# Patient Record
Sex: Female | Born: 1975 | Race: White | Hispanic: No | Marital: Married | Smoking: Never smoker
Health system: Southern US, Community
[De-identification: ages and names within clinical notes are randomized; demographics above are authoritative.]

## PROBLEM LIST (undated history)

## (undated) DIAGNOSIS — Z8041 Family history of malignant neoplasm of ovary: Secondary | ICD-10-CM

## (undated) DIAGNOSIS — I10 Essential (primary) hypertension: Secondary | ICD-10-CM

## (undated) DIAGNOSIS — R87612 Low grade squamous intraepithelial lesion on cytologic smear of cervix (LGSIL): Secondary | ICD-10-CM

## (undated) DIAGNOSIS — J45909 Unspecified asthma, uncomplicated: Secondary | ICD-10-CM

## (undated) DIAGNOSIS — N83209 Unspecified ovarian cyst, unspecified side: Secondary | ICD-10-CM

## (undated) DIAGNOSIS — Z803 Family history of malignant neoplasm of breast: Secondary | ICD-10-CM

## (undated) DIAGNOSIS — I509 Heart failure, unspecified: Secondary | ICD-10-CM

## (undated) HISTORY — DX: Unspecified ovarian cyst, unspecified side: N83.209

## (undated) HISTORY — DX: Family history of malignant neoplasm of breast: Z80.3

## (undated) HISTORY — DX: Unspecified asthma, uncomplicated: J45.909

## (undated) HISTORY — DX: Family history of malignant neoplasm of ovary: Z80.41

## (undated) HISTORY — PX: OTHER SURGICAL HISTORY: SHX169

## (undated) HISTORY — DX: Heart failure, unspecified: I50.9

## (undated) HISTORY — PX: COLPOSCOPY: SHX161

## (undated) HISTORY — DX: Low grade squamous intraepithelial lesion on cytologic smear of cervix (LGSIL): R87.612

## (undated) HISTORY — DX: Essential (primary) hypertension: I10

---

## 2002-10-27 ENCOUNTER — Encounter: Payer: Self-pay | Admitting: Family Medicine

## 2002-10-27 ENCOUNTER — Ambulatory Visit: Admission: RE | Admit: 2002-10-27 | Discharge: 2002-10-27 | Payer: Self-pay | Admitting: Family Medicine

## 2006-01-14 ENCOUNTER — Ambulatory Visit: Payer: Self-pay | Admitting: Family Medicine

## 2006-01-20 ENCOUNTER — Ambulatory Visit: Payer: Self-pay | Admitting: Pulmonary Disease

## 2006-03-24 ENCOUNTER — Ambulatory Visit: Payer: Self-pay

## 2006-05-15 ENCOUNTER — Inpatient Hospital Stay: Payer: Self-pay | Admitting: Obstetrics and Gynecology

## 2008-03-04 ENCOUNTER — Observation Stay: Payer: Self-pay

## 2008-03-20 ENCOUNTER — Inpatient Hospital Stay: Payer: Self-pay | Admitting: Obstetrics & Gynecology

## 2008-03-23 ENCOUNTER — Observation Stay: Payer: Self-pay

## 2008-08-15 ENCOUNTER — Emergency Department: Payer: Self-pay | Admitting: Emergency Medicine

## 2008-10-02 ENCOUNTER — Encounter: Payer: Self-pay | Admitting: Gastroenterology

## 2008-10-02 ENCOUNTER — Ambulatory Visit: Payer: Self-pay | Admitting: Obstetrics & Gynecology

## 2008-10-25 ENCOUNTER — Ambulatory Visit: Payer: Self-pay | Admitting: Gastroenterology

## 2008-10-25 DIAGNOSIS — K5900 Constipation, unspecified: Secondary | ICD-10-CM | POA: Insufficient documentation

## 2008-10-25 DIAGNOSIS — R933 Abnormal findings on diagnostic imaging of other parts of digestive tract: Secondary | ICD-10-CM | POA: Insufficient documentation

## 2008-10-25 LAB — CONVERTED CEMR LAB
ALT: 16 units/L (ref 0–35)
AST: 20 units/L (ref 0–37)
Albumin: 3.4 g/dL — ABNORMAL LOW (ref 3.5–5.2)
Alkaline Phosphatase: 45 units/L (ref 39–117)
BUN: 9 mg/dL (ref 6–23)
Basophils Absolute: 0.1 10*3/uL (ref 0.0–0.1)
Basophils Relative: 0.9 % (ref 0.0–3.0)
Bilirubin, Direct: 0.2 mg/dL (ref 0.0–0.3)
CO2: 28 meq/L (ref 19–32)
Calcium: 9.3 mg/dL (ref 8.4–10.5)
Chloride: 108 meq/L (ref 96–112)
Creatinine, Ser: 0.7 mg/dL (ref 0.4–1.2)
Eosinophils Absolute: 0.8 10*3/uL — ABNORMAL HIGH (ref 0.0–0.7)
Eosinophils Relative: 10.9 % — ABNORMAL HIGH (ref 0.0–5.0)
GFR calc Af Amer: 124 mL/min
GFR calc non Af Amer: 102 mL/min
Glucose, Bld: 97 mg/dL (ref 70–99)
HCT: 37.8 % (ref 36.0–46.0)
Hemoglobin: 13.4 g/dL (ref 12.0–15.0)
Lymphocytes Relative: 26.9 % (ref 12.0–46.0)
MCHC: 35.4 g/dL (ref 30.0–36.0)
MCV: 90.1 fL (ref 78.0–100.0)
Monocytes Absolute: 0.5 10*3/uL (ref 0.1–1.0)
Monocytes Relative: 6 % (ref 3.0–12.0)
Neutro Abs: 4.2 10*3/uL (ref 1.4–7.7)
Neutrophils Relative %: 55.3 % (ref 43.0–77.0)
Platelets: 220 10*3/uL (ref 150–400)
Potassium: 4.3 meq/L (ref 3.5–5.1)
RBC: 4.2 M/uL (ref 3.87–5.11)
RDW: 11.9 % (ref 11.5–14.6)
Sodium: 142 meq/L (ref 135–145)
Total Bilirubin: 1.3 mg/dL — ABNORMAL HIGH (ref 0.3–1.2)
Total Protein: 7 g/dL (ref 6.0–8.3)
WBC: 7.6 10*3/uL (ref 4.5–10.5)

## 2008-10-28 ENCOUNTER — Ambulatory Visit (HOSPITAL_COMMUNITY): Admission: RE | Admit: 2008-10-28 | Discharge: 2008-10-28 | Payer: Self-pay | Admitting: Gastroenterology

## 2008-10-28 LAB — CONVERTED CEMR LAB: AFP-Tumor Marker: 2.1 ng/mL (ref 0.0–8.0)

## 2009-11-20 ENCOUNTER — Inpatient Hospital Stay: Payer: Self-pay

## 2010-09-06 ENCOUNTER — Encounter: Payer: Self-pay | Admitting: Gastroenterology

## 2011-06-02 ENCOUNTER — Ambulatory Visit: Payer: Self-pay

## 2012-09-22 ENCOUNTER — Ambulatory Visit (INDEPENDENT_AMBULATORY_CARE_PROVIDER_SITE_OTHER): Payer: Self-pay | Admitting: Cardiovascular Disease

## 2012-09-22 ENCOUNTER — Encounter: Payer: Self-pay | Admitting: Cardiovascular Disease

## 2012-09-22 VITALS — BP 118/80 | HR 68 | Ht 64.0 in | Wt 177.8 lb

## 2012-09-22 DIAGNOSIS — R079 Chest pain, unspecified: Secondary | ICD-10-CM | POA: Insufficient documentation

## 2012-09-22 NOTE — Patient Instructions (Addendum)
You are doing well. No medication changes were made.  Continue exercise Try red yeast rice as needed for cholesterol  Please call us if you have new issues that need to be addressed before your next appt.

## 2012-09-22 NOTE — Progress Notes (Signed)
   Patient ID: Tina Hill, female    DOB: 1975/09/06, 37 y.o.   MRN: [id number]  HPI Comments: Ms. Bertz is a very pleasant 37 year old woman with no known cardiac history, no diabetes, very remote smoking history for 7 months when she was growing up, mother of 4 children, who presents for evaluation of recent episodes of left-sided chest pain.  She reports having pain starting in October of 2013. She's had approximately 10 episodes. Some having sharp stabbing symptoms lasting for several seconds while at rest, rare episodes of longer lasting dullness in her left chest. 2 episodes while on the treadmill but mostly while at rest. She is busy most of the day, takes care of 4 children. She does have occasional lower extremity swelling. Most of her chest episodes have been in the daytime and while resting. No lightheadedness, dizziness, nausea, sweating. She does have occasional palpitations. She had weight gain after having her fourth child and has been recently dieting and exercising to lose weight.  Previous history of asthma, well controlled recently Father with no significant CAD, mother had aneurysm of her heart, also diabetes and hypertension  EKG shows normal sinus rhythm with rate 70 beats per minute with no significant ST or T wave changes   Medications Currently not on any medications  Review of Systems  Constitutional: Negative.   HENT: Negative.   Eyes: Negative.   Respiratory: Negative.   Cardiovascular: Positive for chest pain.  Gastrointestinal: Negative.   Musculoskeletal: Negative.   Skin: Negative.   Neurological: Negative.   Hematological: Negative.   Psychiatric/Behavioral: Negative.   All other systems reviewed and are negative.     BP 118/80  Pulse 68  Ht 5\' 4"  (1.626 m)  Wt 177 lb 12 oz (80.627 kg)  BMI 30.51 kg/m2  Physical Exam  Nursing note and vitals reviewed. Constitutional: She is oriented to person, place, and time. She appears well-developed  and well-nourished.  HENT:  Head: Normocephalic.  Nose: Nose normal.  Mouth/Throat: Oropharynx is clear and moist.  Eyes: Conjunctivae normal are normal. Pupils are equal, round, and reactive to light.  Neck: Normal range of motion. Neck supple. No JVD present.  Cardiovascular: Normal rate, regular rhythm, S1 normal, S2 normal, normal heart sounds and intact distal pulses.  Exam reveals no gallop and no friction rub.   No murmur heard. Pulmonary/Chest: Effort normal and breath sounds normal. No respiratory distress. She has no wheezes. She has no rales. She exhibits no tenderness.  Abdominal: Soft. Bowel sounds are normal. She exhibits no distension. There is no tenderness.  Musculoskeletal: Normal range of motion. She exhibits no edema and no tenderness.  Lymphadenopathy:    She has no cervical adenopathy.  Neurological: She is alert and oriented to person, place, and time. Coordination normal.  Skin: Skin is warm and dry. No rash noted. No erythema.  Psychiatric: She has a normal mood and affect. Her behavior is normal. Judgment and thought content normal.         Assessment and Plan

## 2012-09-22 NOTE — Assessment & Plan Note (Signed)
Atypical chest pain, typically at rest, not with exertion. She is working out on a regular basis now with minimal symptoms. We did discuss treadmill testing. She does not have any risk factors. She will continue to work out and call our office for further symptoms.   We have discussed weight loss, continued exercise, possibly starting red yeast rice if cholesterol continues to be elevated.

## 2017-10-24 ENCOUNTER — Ambulatory Visit: Payer: Self-pay | Admitting: Obstetrics and Gynecology

## 2017-11-15 ENCOUNTER — Ambulatory Visit: Payer: Self-pay | Admitting: Obstetrics and Gynecology

## 2017-11-15 ENCOUNTER — Encounter: Payer: Self-pay | Admitting: Obstetrics and Gynecology

## 2019-04-05 ENCOUNTER — Ambulatory Visit (INDEPENDENT_AMBULATORY_CARE_PROVIDER_SITE_OTHER): Payer: Self-pay | Admitting: Obstetrics and Gynecology

## 2019-04-05 ENCOUNTER — Other Ambulatory Visit: Payer: Self-pay

## 2019-04-05 ENCOUNTER — Encounter: Payer: Self-pay | Admitting: Obstetrics and Gynecology

## 2019-04-05 VITALS — BP 110/80 | Ht 64.0 in | Wt 181.4 lb

## 2019-04-05 DIAGNOSIS — M545 Low back pain, unspecified: Secondary | ICD-10-CM

## 2019-04-05 DIAGNOSIS — R102 Pelvic and perineal pain unspecified side: Secondary | ICD-10-CM

## 2019-04-05 DIAGNOSIS — R350 Frequency of micturition: Secondary | ICD-10-CM

## 2019-04-05 DIAGNOSIS — Z124 Encounter for screening for malignant neoplasm of cervix: Secondary | ICD-10-CM

## 2019-04-05 LAB — POCT URINALYSIS DIPSTICK
Bilirubin, UA: NEGATIVE
Blood, UA: NEGATIVE
Glucose, UA: NEGATIVE
Ketones, UA: NEGATIVE
Nitrite, UA: NEGATIVE
Protein, UA: NEGATIVE
Spec Grav, UA: 1.01 (ref 1.010–1.025)
pH, UA: 6 (ref 5.0–8.0)

## 2019-04-05 MED ORDER — CIPROFLOXACIN HCL 500 MG PO TABS: 500.0000 mg | ORAL_TABLET | Freq: Two times a day (BID) | ORAL | 0 refills | Status: AC

## 2019-04-05 NOTE — Progress Notes (Signed)
Tower, Wynelle Fanny, MD   Chief Complaint  Patient presents with  . Pelvic Pain    started off on back left side moved to pelvic area on off for the last week    HPI:      Ms. Tina Hill is a 43 y.o. [id number] who LMP was Patient's last menstrual period was 03/26/2019 (exact date)., presents today for NP> 64yrs eval of back and pelvic pain. Pain started as sharp, stabbing LT flank/mid-back pain about a wk ago, lasting 15 min. Pt couldn't find comfortable position and had mild nausea. Sx decreased and since then have moved to a dull, achy LLQ, constant pain and LT low back pain. Sx improve with NSAIDs. Pt with urinary frequency/urgency/chills, without dysuria/hematuria. No vag or GI sx, no hx of kidney stones. Hx of ovar cyst in past. Pt with monthly menses. Pt is sex active with husband, using vasectomy, no dyspareunia.   Last pap 05/26/15 no abnormalities/neg HPV DNA. 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 FH ovar cancer. Due for annual and mammo but is self-pay.   Past Medical History:  Diagnosis Date  . Asthma   . Family history of breast cancer   . Family history of ovarian cancer   . Ovarian cyst   . Pap smear abnormality of cervix with LGSIL     Past Surgical History:  Procedure Laterality Date  . COLPOSCOPY    . none      Family History  Problem Relation Age of Onset  . Breast cancer Mother 66  . Hypertension Mother   . Hypothyroidism Mother   . Ovarian cancer Mother 31  . Hypothyroidism Sister     Social History   Socioeconomic History  . Marital status: Married    Spouse name: Not on file  . Number of children: Not on file  . Years of education: Not on file  . Highest education level: Not on file  Occupational History  . Not on file  Social Needs  . Financial resource strain: Not on file  . Food insecurity    Worry: Not on file    Inability: Not on file  . Transportation needs    Medical: Not on file    Non-medical: Not on file  Tobacco Use  . Smoking status:  Never Smoker  . Smokeless tobacco: Never Used  Substance and Sexual Activity  . Alcohol use: Yes  . Drug use: Never  . Sexual activity: Yes    Birth control/protection: None, Surgical    Comment: Vasectomy  Lifestyle  . Physical activity    Days per week: Not on file    Minutes per session: Not on file  . Stress: Not on file  Relationships  . Social Herbalist on phone: Not on file    Gets together: Not on file    Attends religious service: Not on file    Active member of club or organization: Not on file    Attends meetings of clubs or organizations: Not on file    Relationship status: Not on file  . Intimate partner violence    Fear of current or ex partner: Not on file    Emotionally abused: Not on file    Physically abused: Not on file    Forced sexual activity: Not on file  Other Topics Concern  . Not on file  Social History Narrative  . Not on file    No outpatient medications prior to visit.  No facility-administered medications prior to visit.       ROS:  Review of Systems  Constitutional: Positive for chills. Negative for fatigue, fever and unexpected weight change.  Respiratory: Negative for cough, shortness of breath and wheezing.   Cardiovascular: Negative for chest pain, palpitations and leg swelling.  Gastrointestinal: Negative for blood in stool, constipation, diarrhea, nausea and vomiting.  Endocrine: Negative for cold intolerance, heat intolerance and polyuria.  Genitourinary: Positive for frequency and pelvic pain. Negative for dyspareunia, dysuria, flank pain, genital sores, hematuria, menstrual problem, urgency, vaginal bleeding, vaginal discharge and vaginal pain.  Musculoskeletal: Positive for back pain. Negative for joint swelling and myalgias.  Skin: Negative for rash.  Neurological: Negative for dizziness, syncope, light-headedness, numbness and headaches.  Hematological: Negative for adenopathy.  Psychiatric/Behavioral: Negative  for agitation, confusion, sleep disturbance and suicidal ideas. The patient is not nervous/anxious.     OBJECTIVE:   Vitals:  BP 110/80   Ht 5\' 4"  (1.626 m)   Wt 181 lb 6.4 oz (82.3 kg)   LMP 03/26/2019 (Exact Date)   BMI 31.14 kg/m   Physical Exam Vitals signs reviewed.  Constitutional:      Appearance: She is well-developed. She is not ill-appearing or toxic-appearing.  Neck:     Musculoskeletal: Normal range of motion.  Pulmonary:     Effort: Pulmonary effort is normal.  Abdominal:     Tenderness: There is abdominal tenderness in the right lower quadrant, suprapubic area and left lower quadrant. There is left CVA tenderness. There is no right CVA tenderness, guarding or rebound.  Genitourinary:    General: Normal vulva.     Pubic Area: No rash.      Labia:        Right: No rash, tenderness or lesion.        Left: No rash, tenderness or lesion.      Vagina: Normal. No vaginal discharge, erythema or tenderness.     Cervix: Normal.     Uterus: Normal. Tender. Not enlarged.      Adnexa:        Right: Tenderness present. No mass.         Left: Tenderness present. No mass.    Musculoskeletal: Normal range of motion.  Skin:    General: Skin is warm and dry.  Neurological:     General: No focal deficit present.     Mental Status: She is alert and oriented to person, place, and time.     Cranial Nerves: No cranial nerve deficit.  Psychiatric:        Mood and Affect: Mood normal.        Behavior: Behavior normal.        Thought Content: Thought content normal.        Judgment: Judgment normal.     Results: Results for orders placed or performed in visit on 04/05/19 (from the past 24 hour(s))  POCT Urinalysis Dipstick     Status: Abnormal   Collection Time: 04/05/19 10:17 AM  Result Value Ref Range   Color, UA yellow    Clarity, UA clear    Glucose, UA Negative Negative   Bilirubin, UA neg    Ketones, UA neg    Spec Grav, UA 1.010 1.010 - 1.025   Blood, UA neg     pH, UA 6.0 5.0 - 8.0   Protein, UA Negative Negative   Urobilinogen, UA     Nitrite, UA neg    Leukocytes, UA Trace (A) Negative  Appearance     Odor      Assessment/Plan: Urinary frequency - Questionable UA, Check C&S. Will call with results. Given sx, treat with cipro for now.  - Plan: POCT Urinalysis Dipstick, Urine Culture, ciprofloxacin (CIPRO) 500 MG tablet  Acute left-sided low back pain without sciatica - Question kidney stones given sx hx. Treat for UTI in meantime to see what sx do. Pt stable currently. If sx persist after abx, will send for kidney stone eval - Plan: ciprofloxacin (CIPRO) 500 MG tablet  Pelvic pain - Question UTI vs referred kidney pain vs other. Will f/u with C&S results. May need u/s.   Cervical cancer screening - Refer to [facility] for annual/mammo - Plan: Ambulatory Referral to [facility]  PT IS SELF PAY  Meds ordered this encounter  Medications  . ciprofloxacin (CIPRO) 500 MG tablet    Sig: Take 1 tablet (500 mg total) by mouth 2 (two) times daily for 5 days.    Dispense:  10 tablet    Refill:  0    Order Specific Question:   Supervising Provider    Answer:   Nadara MustardHARRIS, ROBERT P [[id number]]      Return if symptoms worsen or fail to improve.  Alicia B. Copland, PA-C 04/05/2019 10:21 AM

## 2019-04-05 NOTE — Patient Instructions (Signed)
I value your feedback and entrusting us with your care. If you get a [facility] patient survey, I would appreciate you taking the time to let us know about your experience today. Thank you! 

## 2019-04-07 LAB — URINE CULTURE

## 2019-05-04 ENCOUNTER — Telehealth: Payer: Self-pay | Admitting: Obstetrics and Gynecology

## 2019-05-04 NOTE — Telephone Encounter (Signed)
Called and spoke with patient to call and contact Alyse Low or Vita Barley at [facility] for Assistance with Cervical Cancer Screening. [phone]

## 2019-05-16 ENCOUNTER — Ambulatory Visit: Payer: Self-pay

## 2019-05-23 ENCOUNTER — Ambulatory Visit: Payer: Self-pay

## 2019-06-05 ENCOUNTER — Other Ambulatory Visit: Payer: Self-pay

## 2019-06-06 ENCOUNTER — Ambulatory Visit
Admission: RE | Admit: 2019-06-06 | Discharge: 2019-06-06 | Disposition: A | Discharge status: Home / Self Care | Payer: Self-pay | Source: Ambulatory Visit | Attending: Oncology | Admitting: Oncology

## 2019-06-06 ENCOUNTER — Encounter: Payer: Self-pay | Admitting: *Deleted

## 2019-06-06 ENCOUNTER — Other Ambulatory Visit: Payer: Self-pay

## 2019-06-06 ENCOUNTER — Ambulatory Visit: Payer: Self-pay | Attending: Oncology | Admitting: *Deleted

## 2019-06-06 ENCOUNTER — Encounter: Payer: Self-pay | Admitting: Obstetrics and Gynecology

## 2019-06-06 VITALS — BP 132/77 | HR 62 | Temp 98.7°F | Ht 64.0 in | Wt 185.0 lb

## 2019-06-06 DIAGNOSIS — Z0000 Encounter for general adult medical examination without abnormal findings: Secondary | ICD-10-CM | POA: Insufficient documentation

## 2019-06-06 NOTE — Progress Notes (Signed)
  Subjective:     Patient ID: Tina Hill, female   DOB: 04-21-1976, 43 y.o.   MRN: [id number]  HPI   Review of Systems     Objective:   Physical Exam Chest:     Breasts:        Right: No swelling, bleeding, inverted nipple, mass, nipple discharge, skin change or tenderness.        Left: No swelling, bleeding, inverted nipple, mass, nipple discharge, skin change or tenderness.    Lymphadenopathy:     Upper Body:     Right upper body: No supraclavicular or axillary adenopathy.     Left upper body: No supraclavicular or axillary adenopathy.        Assessment:     43 year old White female presented to [facility] for clinical breast exam and pap smear.  Clinical breast exam without dominant mass, skin changes, nipple discharge or lymphadenopathy.  Taught self breast awareness.  Collected for pap smear without difficulty.  Tyrer-Cuzick breast cancer risk assessment with a lifetime risk of 24%.  Per NCCN guidelines patient meets guidelines for modified imagaing and genetic testing.  Discussed high risk for breast cancer and need to be referred to our high risk breast clinic for possible genetic testing.  Patient with family history of her mom with breast cancer times 3 and ovarian cancer.  On clinical exam, the patient's thyroid felt enlarged.  She does complain of increased fatigue and intolerance to heat and cold.  She does not have a primary care provider at this time.  Patient has been screened for eligibility.  She does not have any insurance, Medicare or Medicaid.  She also meets financial eligibility.  Hand-out given on the Affordable Care Act.    Plan:     Screening mammogram ordered.  Specimen for pap sent to the lab.  Referral to high risk clinic placed.  Jeanella Anton to assist with scheduling patient to see a primary care provider for her enlarged thyroid.  Will follow up per [facility] protocol.

## 2019-06-12 ENCOUNTER — Encounter: Payer: Self-pay | Admitting: *Deleted

## 2019-06-13 ENCOUNTER — Other Ambulatory Visit: Payer: Self-pay

## 2019-06-14 ENCOUNTER — Inpatient Hospital Stay: Payer: Self-pay | Attending: Oncology | Admitting: Oncology

## 2019-06-14 ENCOUNTER — Encounter: Payer: Self-pay | Admitting: Oncology

## 2019-06-14 ENCOUNTER — Other Ambulatory Visit: Payer: Self-pay

## 2019-06-14 VITALS — BP 148/74 | HR 87 | Temp 98.5°F | Resp 16 | Wt 188.9 lb

## 2019-06-14 DIAGNOSIS — Z8041 Family history of malignant neoplasm of ovary: Secondary | ICD-10-CM | POA: Insufficient documentation

## 2019-06-14 DIAGNOSIS — Z0000 Encounter for general adult medical examination without abnormal findings: Secondary | ICD-10-CM

## 2019-06-14 DIAGNOSIS — R5383 Other fatigue: Secondary | ICD-10-CM | POA: Insufficient documentation

## 2019-06-14 DIAGNOSIS — Z803 Family history of malignant neoplasm of breast: Secondary | ICD-10-CM | POA: Insufficient documentation

## 2019-06-14 NOTE — Progress Notes (Signed)
Here for evaluation of family history of breast cancer.

## 2019-06-15 LAB — PAP LB AND HPV HIGH-RISK: HPV, high-risk: NEGATIVE

## 2019-06-18 ENCOUNTER — Encounter: Payer: Self-pay | Admitting: *Deleted

## 2019-06-18 ENCOUNTER — Other Ambulatory Visit: Payer: Self-pay | Admitting: *Deleted

## 2019-06-18 DIAGNOSIS — Z1231 Encounter for screening mammogram for malignant neoplasm of breast: Secondary | ICD-10-CM

## 2019-06-18 NOTE — Progress Notes (Signed)
Called and left patient a message to return my call.  I would like to refer her to a gynecologist for evaluation of endometrial cells on her pap smear.

## 2019-06-18 NOTE — Progress Notes (Signed)
Hematology/Oncology Consult note [facility] Telephone:([phone] [phone]   Patient Care Team: Copland, [name] as PCP - General (Obstetrics and Gynecology) Rico Junker, RN as Registered Nurse Theodore Demark, RN as Registered Nurse  REFERRING PROVIDER: Lloyd Huger, MD  CHIEF COMPLAINTS/REASON FOR VISIT:  Evaluation of high risk breast cancer  HISTORY OF PRESENTING ILLNESS:   Tina Hill is a  43 y.o.  female with PMH listed below was seen in consultation at the request of  Grayland Ormond, Kathlene November, MD  for evaluation of high risk breast cancer. Patient reports family history of ovarian cancer and breast cancer.  Mother was diagnosed of ovarian cancer at age of 41.  Mom was diagnosed with breast cancer for 3 times per patient. She denies any breast mass, skin changes, nipple discharges. She reports increased fatigue and intolerance of heat and cold.  Is being set up to see primary care provider for evaluation of thyroid function. Menarche [age], first live birth 5  Review of Systems  Constitutional: Positive for fatigue. Negative for appetite change, chills and fever.  HENT:   Negative for hearing loss and voice change.   Eyes: Negative for eye problems.  Respiratory: Negative for chest tightness and cough.   Cardiovascular: Negative for chest pain.  Gastrointestinal: Negative for abdominal distention, abdominal pain and blood in stool.  Endocrine: Negative for hot flashes.  Genitourinary: Negative for difficulty urinating and frequency.   Musculoskeletal: Negative for arthralgias.  Skin: Negative for itching and rash.  Neurological: Negative for extremity weakness.  Hematological: Negative for adenopathy.  Psychiatric/Behavioral: Negative for confusion.    MEDICAL HISTORY:  Past Medical History:  Diagnosis Date  . Asthma   . Family history of breast cancer   . Family history of ovarian cancer   . Ovarian cyst    . Pap smear abnormality of cervix with LGSIL     SURGICAL HISTORY: Past Surgical History:  Procedure Laterality Date  . COLPOSCOPY    . none      SOCIAL HISTORY: Social History   Socioeconomic History  . Marital status: Married    Spouse name: Not on file  . Number of children: Not on file  . Years of education: Not on file  . Highest education level: Not on file  Occupational History  . Not on file  Social Needs  . Financial resource strain: Not on file  . Food insecurity    Worry: Not on file    Inability: Not on file  . Transportation needs    Medical: Not on file    Non-medical: Not on file  Tobacco Use  . Smoking status: Never Smoker  . Smokeless tobacco: Never Used  Substance and Sexual Activity  . Alcohol use: Yes  . Drug use: Never  . Sexual activity: Yes    Birth control/protection: None, Surgical    Comment: Vasectomy  Lifestyle  . Physical activity    Days per week: Not on file    Minutes per session: Not on file  . Stress: Not on file  Relationships  . Social Herbalist on phone: Not on file    Gets together: Not on file    Attends religious service: Not on file    Active member of club or organization: Not on file    Attends meetings of clubs or organizations: Not on file    Relationship status: Not on file  . Intimate partner violence  Fear of current or ex partner: Not on file    Emotionally abused: Not on file    Physically abused: Not on file    Forced sexual activity: Not on file  Other Topics Concern  . Not on file  Social History Narrative  . Not on file    FAMILY HISTORY: Family History  Problem Relation Age of Onset  . Breast cancer Mother 25  . Hypertension Mother   . Hypothyroidism Mother   . Ovarian cancer Mother 42  . 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  MEDICATIONS:  No current outpatient medications on file.   No current facility-administered medications for this  visit.      PHYSICAL EXAMINATION: ECOG PERFORMANCE STATUS: 1 - Symptomatic but completely ambulatory Vitals:   06/14/19 0930  BP: (!) 148/74  Pulse: 87  Resp: 16  Temp: 98.5 F (36.9 C)  SpO2: 100%   Filed Weights   06/14/19 0930  Weight: 188 lb 14.4 oz (85.7 kg)    Physical Exam Constitutional:      General: She is not in acute distress. HENT:     Head: Normocephalic and atraumatic.  Eyes:     General: No scleral icterus.    Pupils: Pupils are equal, round, and reactive to light.  Neck:     Musculoskeletal: Normal range of motion and neck supple.     Comments: Enlarged thyroid Cardiovascular:     Rate and Rhythm: Normal rate and regular rhythm.     Heart sounds: Normal heart sounds.  Pulmonary:     Effort: Pulmonary effort is normal. No respiratory distress.     Breath sounds: No wheezing.  Abdominal:     General: Bowel sounds are normal. There is no distension.     Palpations: Abdomen is soft. There is no mass.     Tenderness: There is no abdominal tenderness.  Musculoskeletal: Normal range of motion.        General: No deformity.  Skin:    General: Skin is warm and dry.     Findings: No erythema or rash.  Neurological:     Mental Status: She is alert and oriented to person, place, and time.     Cranial Nerves: No cranial nerve deficit.     Coordination: Coordination normal.  Psychiatric:        Behavior: Behavior normal.        Thought Content: Thought content normal.   Patient declines breast exam  LABORATORY DATA:  I have reviewed the data as listed Lab Results  Component Value Date   WBC 7.6 10/25/2008   HGB 13.4 10/25/2008   HCT 37.8 10/25/2008   MCV 90.1 10/25/2008   PLT 220 10/25/2008   No results for input(s): NA, K, CL, CO2, GLUCOSE, BUN, CREATININE, CALCIUM, GFRNONAA, GFRAA, PROT, ALBUMIN, AST, ALT, ALKPHOS, BILITOT, BILIDIR, IBILI in the last 8760 hours. Iron/TIBC/Ferritin/ %Sat No results found for: IRON, TIBC, FERRITIN, IRONPCTSAT     RADIOGRAPHIC STUDIES: I have personally reviewed the radiological images as listed and agreed with the findings in the report.  Ms Digital Screening Tomo Bilateral  Result Date: 06/06/2019 CLINICAL DATA:  Screening. EXAM: DIGITAL SCREENING BILATERAL MAMMOGRAM WITH TOMO AND CAD COMPARISON:  Previous exam(s). ACR Breast Density Category c: The breast tissue is heterogeneously dense, which may obscure small masses. FINDINGS: There are no findings suspicious for malignancy. Images were processed with CAD. IMPRESSION: No mammographic evidence of malignancy. A result letter of  this screening mammogram will be mailed directly to the patient. RECOMMENDATION: 1.  Screening mammogram in one year. (Code:SM-B-01Y) 2. Per American Cancer Society guidelines, if the patient has a calculated lifetime risk of developing breast cancer of greater than 20%, annual screening MRI of the breasts would be recommended at the time of screening mammography. BI-RADS CATEGORY  1: Negative. Electronically Signed   By: Ammie Ferrier M.D.   On: 06/06/2019 14:10      ASSESSMENT & PLAN:  1. Family history of breast cancer   2. Preventative health care   3. Family history of ovarian cancer    Mammogram was independent reviewed by me discussed with patient.  No evidence of malignancy. Baker Janus model 1.4% in 5 years, 18.3 % lifetime developing breast cancer.  Discussed with patient that for patient who has gail model breast cancer risk less than 1.7%, chemoprevention is not offered. Tyrer-Cuzick breast cancer risk 24%, discussed with patient this is used to calculate the patient's likelihood of carry BRCA mutation.  Given her family history of ovarian cancer and breast cancer, I recommend patient to discuss with genetic counselor and proceed with genetic testing.  She voices understanding and agrees with the treatment.  Orders Placed This Encounter  Procedures  . Ambulatory referral to Genetics    Referral Priority:   Routine     Referral Type:   Consultation    Referral Reason:   Specialty Services Required    Referred to Provider:   Faith Rogue T    Number of Visits Requested:   1    All questions were answered. The patient knows to call the clinic with any problems questions or concerns.   Lloyd Huger, MD    Return of visit: Follow-up to be determined Thank you for this kind referral and the opportunity to participate in the care of this patient. A copy of today's note is routed to referring provider  Total face to face encounter time for this patient visit was 45 min. >50% of the time was  spent in counseling and coordination of care.    Earlie Server, MD, PhD Hematology Oncology [facility] Pager- [phone] 06/18/2019

## 2019-06-18 NOTE — Progress Notes (Signed)
Called patient and discussed her normal HPV negative pap, but that there are endometrial cells present on the cytology.  Patient states she is still having regular month cycles, but sometimes her period may be a day longer than usual.  I think with a BMI of over 31 she is considered overweight, and that is a risk factor for endometrial cancer.  I think it would be prudent to refer her to GYN for further evaluation.  She has been scheduled to see Dr. Kenton Kingfisher on 06/28/19 @ 11:20.  I also discussed need for 6 month follow up breast MRI since she is considered high risk for breast cancer.  I will schedule that appointment and mail it to her.  She has an appointment with a genetic counselor in January.  Will follow up per [facility] protocol.

## 2019-06-27 ENCOUNTER — Other Ambulatory Visit: Payer: Self-pay

## 2019-06-27 ENCOUNTER — Other Ambulatory Visit (HOSPITAL_COMMUNITY)
Admission: RE | Admit: 2019-06-27 | Discharge: 2019-06-27 | Disposition: A | Discharge status: Home / Self Care | Payer: Self-pay | Source: Ambulatory Visit | Attending: Obstetrics & Gynecology | Admitting: Obstetrics & Gynecology

## 2019-06-27 ENCOUNTER — Encounter: Payer: Self-pay | Admitting: Obstetrics & Gynecology

## 2019-06-27 ENCOUNTER — Ambulatory Visit (INDEPENDENT_AMBULATORY_CARE_PROVIDER_SITE_OTHER): Payer: Self-pay | Admitting: Obstetrics & Gynecology

## 2019-06-27 VITALS — BP 120/80 | Ht 64.0 in | Wt 189.0 lb

## 2019-06-27 DIAGNOSIS — R87618 Other abnormal cytological findings on specimens from cervix uteri: Secondary | ICD-10-CM

## 2019-06-27 NOTE — Progress Notes (Signed)
HPI:  Patient is a 43 y.o. [id number] presenting for concerns over recent PAP results from [facility].  SHe had normal PAP and HPV yet Endometrial Cells on PAP that are unexplained.  Not on period at that time.  Has reg cycles.   Prior abn PAP 2012, normal colpo then.  BC- vasec.  PMHx: She  has a past medical history of Asthma, Family history of breast cancer, Family history of ovarian cancer, Ovarian cyst, and Pap smear abnormality of cervix with LGSIL. Also,  has a past surgical history that includes none and Colposcopy., family history includes Breast cancer (age of onset: 64) in her mother; Hypertension in her mother; Hypothyroidism in her mother and sister; Ovarian cancer (age of onset: 65) in her mother.,  reports that she has never smoked. She has never used smokeless tobacco. She reports current alcohol use. She reports that she does not use drugs.  She currently has no medications in their medication list. Also, is allergic to macadamia nut oil.  Review of Systems  Constitutional: Negative for chills, fever and malaise/fatigue.  HENT: Negative for congestion, sinus pain and sore throat.   Eyes: Negative for blurred vision and pain.  Respiratory: Negative for cough and wheezing.   Cardiovascular: Negative for chest pain and leg swelling.  Gastrointestinal: Negative for abdominal pain, constipation, diarrhea, heartburn, nausea and vomiting.  Genitourinary: Negative for dysuria, frequency, hematuria and urgency.  Musculoskeletal: Negative for back pain, joint pain, myalgias and neck pain.  Skin: Negative for itching and rash.  Neurological: Negative for dizziness, tremors and weakness.  Endo/Heme/Allergies: Does not bruise/bleed easily.  Psychiatric/Behavioral: Negative for depression. The patient is not nervous/anxious and does not have insomnia.     Objective: BP 120/80   Ht 5\' 4"  (1.626 m)   Wt 189 lb (85.7 kg)   LMP 06/26/2019   BMI 32.44 kg/m  Filed Weights   06/27/19 1120   Weight: 189 lb (85.7 kg)   Body mass index is 32.44 kg/m.  Physical examination Physical Exam Constitutional:      General: She is not in acute distress.    Appearance: She is well-developed.  Genitourinary:     Pelvic exam was performed with patient supine.     Vagina and uterus normal.     No vaginal erythema or bleeding.     No cervical motion tenderness, discharge, polyp or nabothian cyst.     Uterus is mobile.     Uterus is not enlarged.     No uterine mass detected.    Uterus is midaxial.     No right or left adnexal mass present.     Right adnexa not tender.     Left adnexa not tender.  HENT:     Head: Normocephalic and atraumatic.     Nose: Nose normal.  Abdominal:     General: There is no distension.     Palpations: Abdomen is soft.     Tenderness: There is no abdominal tenderness.  Musculoskeletal: Normal range of motion.  Neurological:     Mental Status: She is alert and oriented to person, place, and time.     Cranial Nerves: No cranial nerve deficit.  Skin:    General: Skin is warm and dry.  Psychiatric:        Attention and Perception: Attention normal.        Mood and Affect: Mood and affect normal.        Speech: Speech normal.  Behavior: Behavior normal.        Thought Content: Thought content normal.        Judgment: Judgment normal.     ASSESSMENT:     ICD-10-CM   1. Unexplained endometrial cells on cervical cytology  R87.618 Surgical pathology    Endometrial Biopsy After discussion with the patient regarding her abnormal uterine bleeding I recommended that she proceed with an endometrial biopsy for further diagnosis. The risks, benefits, alternatives, and indications for an endometrial biopsy were discussed with the patient in detail. She understood the risks including infection, bleeding, cervical laceration and uterine perforation.  Verbal consent was obtained.   PROCEDURE NOTE:  Pipelle endometrial biopsy was performed using aseptic  technique with iodine preparation.  The uterus was sounded to a length of 7 cm.  Adequate sampling was obtained with minimal blood loss.  The patient tolerated the procedure well.  Disposition will be pending pathology.  Barnett Applebaum, MD, [facility], [facility] 06/27/2019  11:40 AM

## 2019-06-27 NOTE — Patient Instructions (Signed)

## 2019-06-28 ENCOUNTER — Ambulatory Visit: Payer: Self-pay

## 2019-06-29 LAB — SURGICAL PATHOLOGY

## 2019-09-13 ENCOUNTER — Inpatient Hospital Stay: Payer: Self-pay | Attending: Oncology | Admitting: Licensed Clinical Social Worker

## 2019-09-13 ENCOUNTER — Inpatient Hospital Stay: Payer: Self-pay

## 2019-12-06 ENCOUNTER — Ambulatory Visit: Admission: RE | Admit: 2019-12-06 | Payer: Self-pay | Source: Ambulatory Visit

## 2020-03-21 IMAGING — MG DIGITAL SCREENING BILAT W/ TOMO W/ CAD
6 of 12 series · 6 of 36 positions shown · non-contrast
Comparison: Previous exam(s).

CLINICAL DATA: Screening.

EXAM:
DIGITAL SCREENING BILATERAL MAMMOGRAM WITH TOMO AND CAD

[R CC synth-2D (1 of 2)]
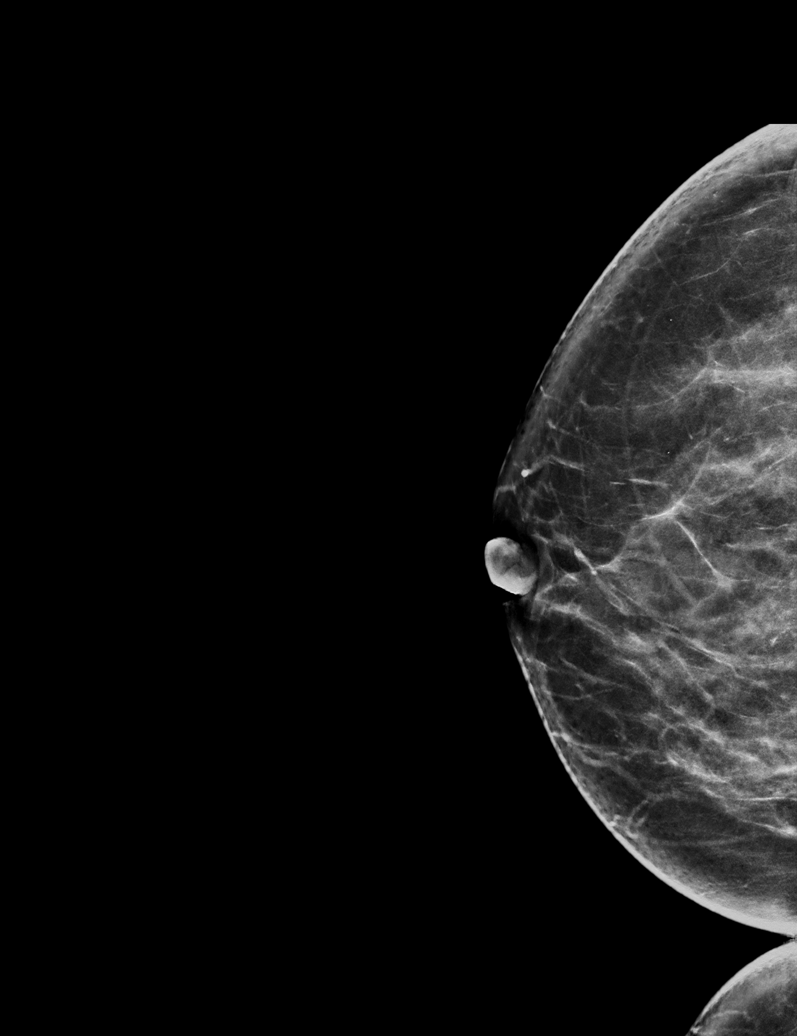

[L MLO synth-2D]
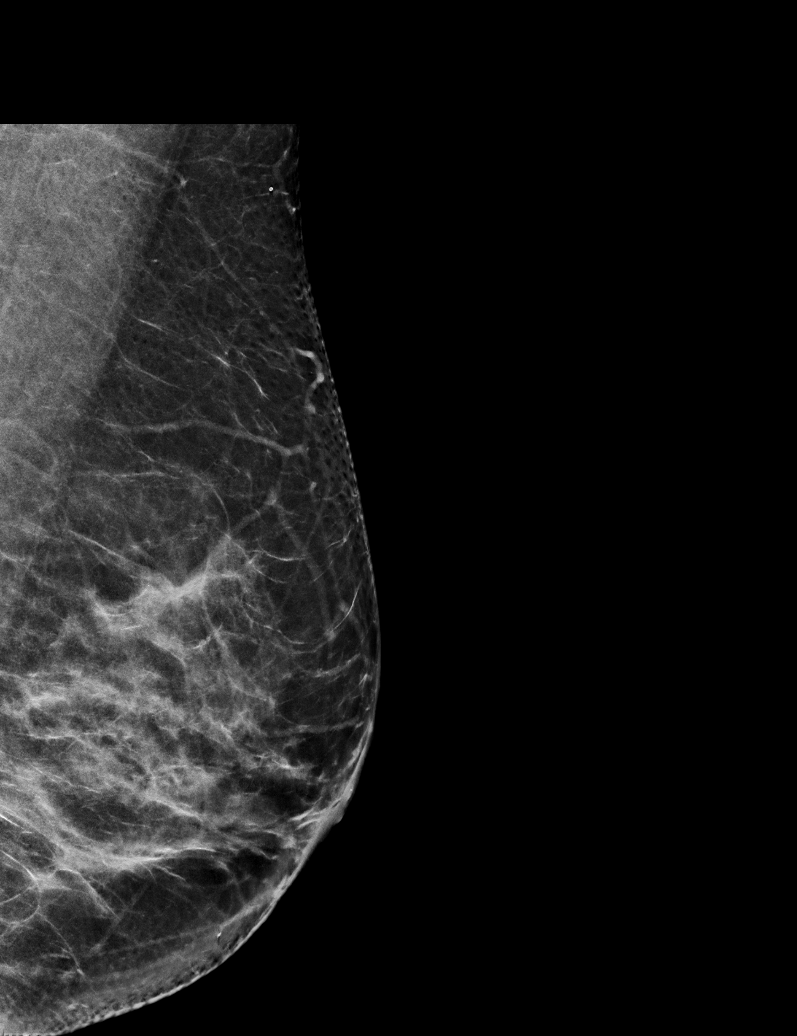

[R MLO synth-2D (1 of 2)]
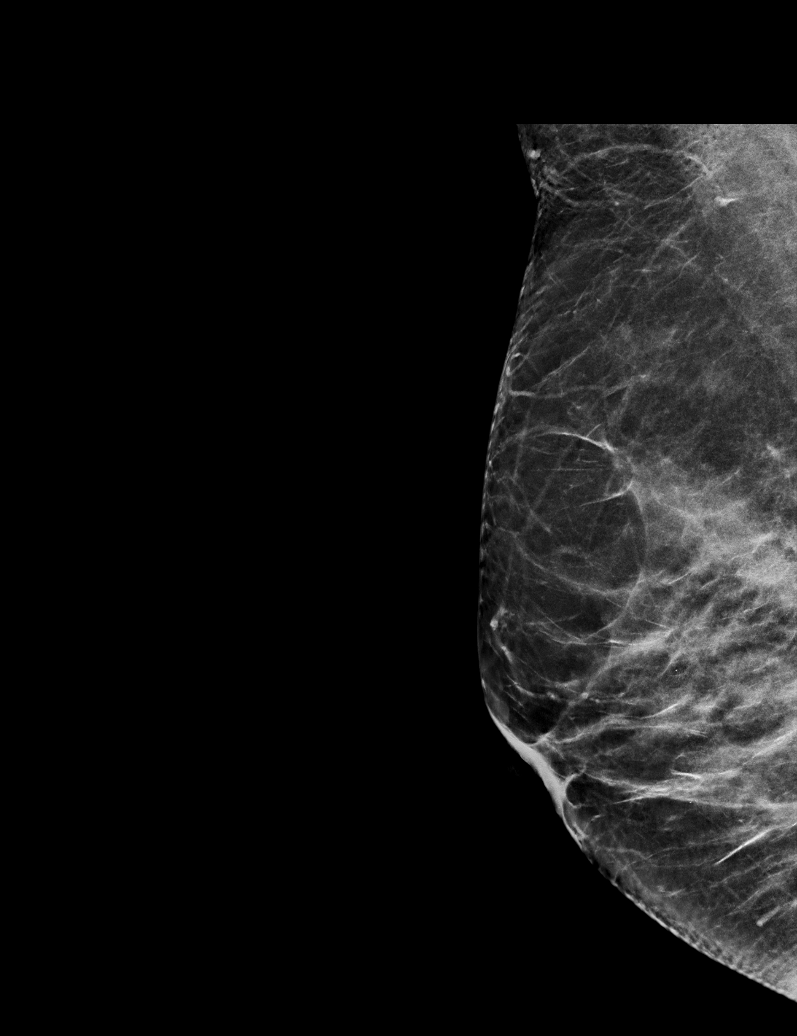

[L CC synth-2D]
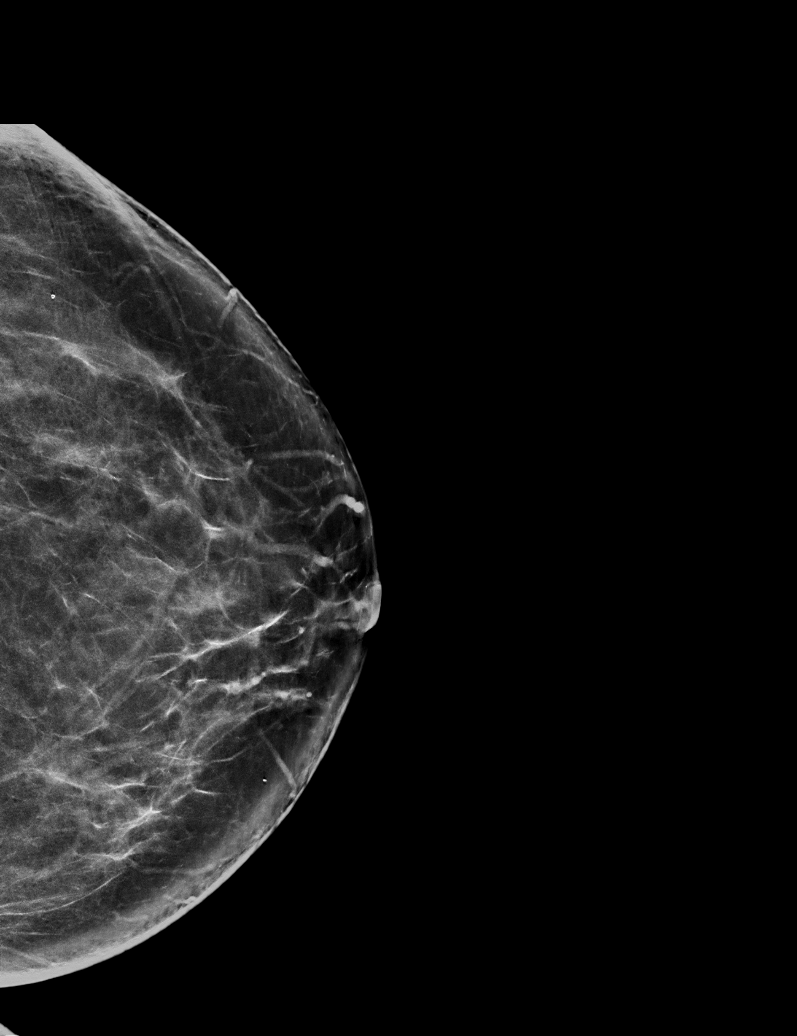

[R CC synth-2D (2 of 2)]
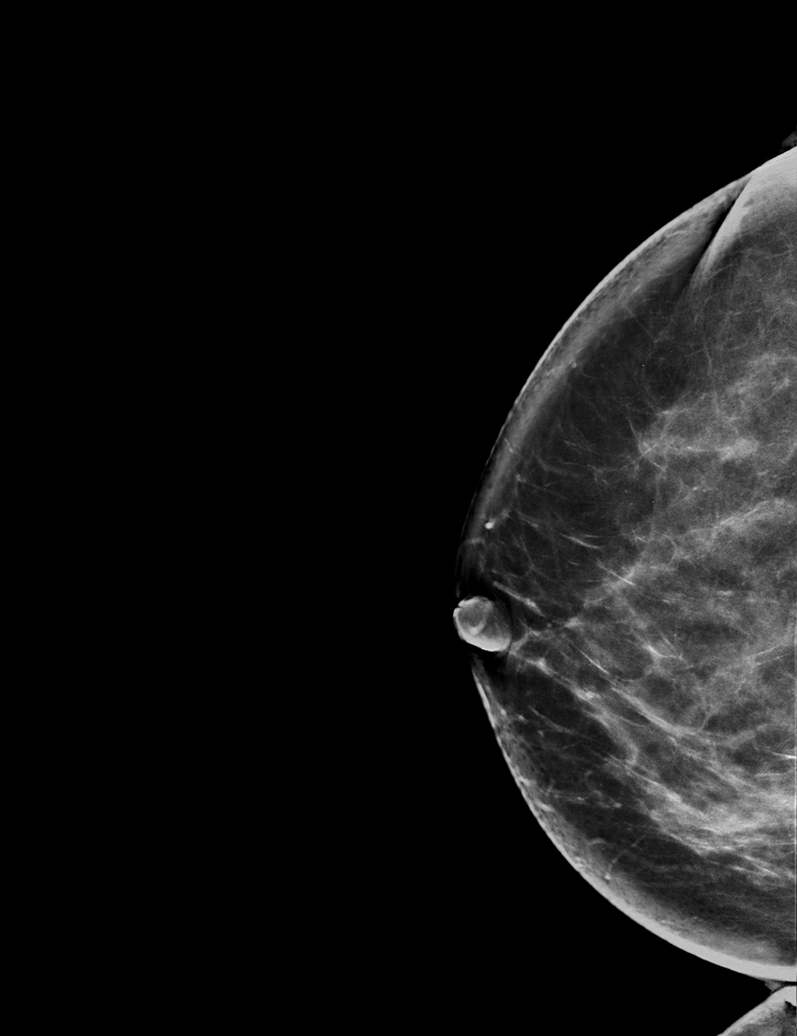

[R MLO synth-2D (2 of 2)]
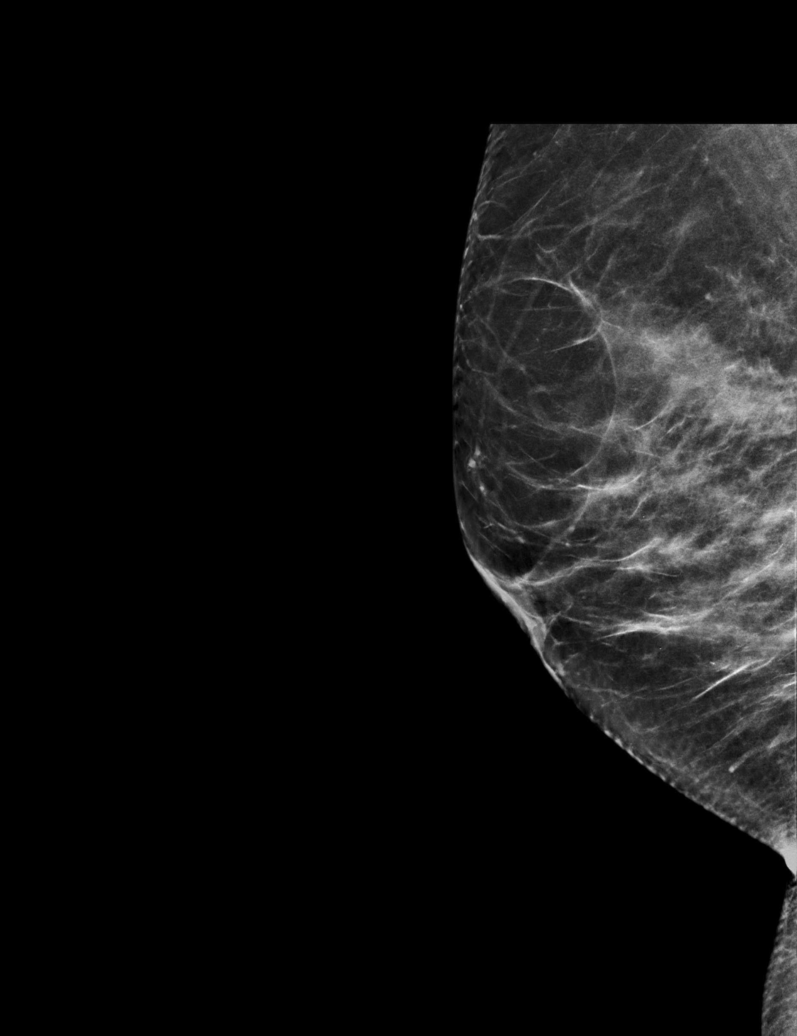

[6 of 36 positions shown; findings below may reference images not displayed]

ACR Breast Density Category c: The breast tissue is heterogeneously
dense, which may obscure small masses.
FINDINGS: There are no findings suspicious for malignancy. Images were
processed with CAD.
IMPRESSION: No mammographic evidence of malignancy. A result letter of this
screening mammogram will be mailed directly to the patient.

RECOMMENDATION:
1.  Screening mammogram in one year. (Code:[id number]-[id number])

2. Per American Cancer Society guidelines, if the patient has a
calculated lifetime risk of developing breast cancer of greater than
20%, annual screening MRI of the breasts would be recommended at the
time of screening mammography.

BI-RADS CATEGORY  1: Negative.

## 2021-04-15 ENCOUNTER — Emergency Department: Payer: Self-pay

## 2021-04-15 ENCOUNTER — Encounter: Payer: Self-pay | Admitting: Emergency Medicine

## 2021-04-15 ENCOUNTER — Telehealth: Payer: Self-pay | Admitting: Urology

## 2021-04-15 ENCOUNTER — Emergency Department
Admission: EM | Admit: 2021-04-15 | Discharge: 2021-04-15 | Disposition: A | Discharge status: Home / Self Care | Payer: Self-pay | Attending: Emergency Medicine | Admitting: Emergency Medicine

## 2021-04-15 ENCOUNTER — Other Ambulatory Visit: Payer: Self-pay

## 2021-04-15 DIAGNOSIS — N23 Unspecified renal colic: Secondary | ICD-10-CM | POA: Insufficient documentation

## 2021-04-15 DIAGNOSIS — J45909 Unspecified asthma, uncomplicated: Secondary | ICD-10-CM | POA: Insufficient documentation

## 2021-04-15 DIAGNOSIS — N200 Calculus of kidney: Secondary | ICD-10-CM | POA: Insufficient documentation

## 2021-04-15 DIAGNOSIS — R8271 Bacteriuria: Secondary | ICD-10-CM

## 2021-04-15 LAB — COMPREHENSIVE METABOLIC PANEL
ALT: 16 U/L (ref 0–44)
AST: 20 U/L (ref 15–41)
Albumin: 3.8 g/dL (ref 3.5–5.0)
Alkaline Phosphatase: 74 U/L (ref 38–126)
Anion gap: 8 (ref 5–15)
BUN: 14 mg/dL (ref 6–20)
CO2: 23 mmol/L (ref 22–32)
Calcium: 8.8 mg/dL — ABNORMAL LOW (ref 8.9–10.3)
Chloride: 105 mmol/L (ref 98–111)
Creatinine, Ser: 0.9 mg/dL (ref 0.44–1.00)
GFR, Estimated: >60 mL/min (ref >60)
Glucose, Bld: 112 mg/dL — ABNORMAL HIGH (ref 70–99)
Potassium: 3.4 mmol/L — ABNORMAL LOW (ref 3.5–5.1)
Sodium: 136 mmol/L (ref 135–145)
Total Bilirubin: 0.9 mg/dL (ref 0.3–1.2)
Total Protein: 7.9 g/dL (ref 6.5–8.1)

## 2021-04-15 LAB — URINALYSIS, ROUTINE W REFLEX MICROSCOPIC
Bilirubin Urine: NEGATIVE
Glucose, UA: NEGATIVE mg/dL
Ketones, ur: NEGATIVE mg/dL
Nitrite: NEGATIVE
Protein, ur: NEGATIVE mg/dL
Specific Gravity, Urine: 1.013 (ref 1.005–1.030)
pH: 6 (ref 5.0–8.0)

## 2021-04-15 LAB — CBC
HCT: 38.7 % (ref 36.0–46.0)
Hemoglobin: 13.6 g/dL (ref 12.0–15.0)
MCH: 32.2 pg (ref 26.0–34.0)
MCHC: 35.1 g/dL (ref 30.0–36.0)
MCV: 91.5 fL (ref 80.0–100.0)
Platelets: 292 10*3/uL (ref 150–400)
RBC: 4.23 MIL/uL (ref 3.87–5.11)
RDW: 12.1 % (ref 11.5–15.5)
WBC: 10.4 10*3/uL (ref 4.0–10.5)
nRBC: 0 % (ref 0.0–0.2)

## 2021-04-15 LAB — POC URINE PREG, ED: Preg Test, Ur: NEGATIVE

## 2021-04-15 LAB — LIPASE, BLOOD: Lipase: 34 U/L (ref 11–51)

## 2021-04-15 MED ORDER — ONDANSETRON 4 MG PO TBDP: ORAL_TABLET | ORAL | 0 refills | Status: DC

## 2021-04-15 MED ORDER — KETOROLAC TROMETHAMINE 30 MG/ML IJ SOLN
15.0000 mg | Freq: Once | INTRAMUSCULAR | Status: AC
  Administered 2021-04-15: 15 mg via INTRAVENOUS
  Filled 2021-04-15: qty 1

## 2021-04-15 MED ORDER — OXYCODONE-ACETAMINOPHEN 5-325 MG PO TABS: 2.0000 | ORAL_TABLET | Freq: Four times a day (QID) | ORAL | 0 refills | Status: DC | PRN

## 2021-04-15 MED ORDER — DOCUSATE SODIUM 100 MG PO CAPS: ORAL_CAPSULE | ORAL | 0 refills | Status: DC

## 2021-04-15 MED ORDER — ONDANSETRON HCL 4 MG/2ML IJ SOLN
4.0000 mg | INTRAMUSCULAR | Status: AC
  Administered 2021-04-15: 4 mg via INTRAVENOUS
  Filled 2021-04-15: qty 2

## 2021-04-15 MED ORDER — TAMSULOSIN HCL 0.4 MG PO CAPS: ORAL_CAPSULE | ORAL | 0 refills | Status: DC

## 2021-04-15 MED ORDER — SODIUM CHLORIDE 0.9 % IV SOLN
1.0000 g | Freq: Once | INTRAVENOUS | Status: AC
  Administered 2021-04-15: 1 g via INTRAVENOUS
  Filled 2021-04-15: qty 10

## 2021-04-15 MED ORDER — CEPHALEXIN 500 MG PO CAPS: 500.0000 mg | ORAL_CAPSULE | Freq: Two times a day (BID) | ORAL | 0 refills | Status: DC

## 2021-04-15 MED ORDER — MORPHINE SULFATE (PF) 4 MG/ML IV SOLN
4.0000 mg | Freq: Once | INTRAVENOUS | Status: AC
  Administered 2021-04-15: 4 mg via INTRAVENOUS
  Filled 2021-04-15: qty 1

## 2021-04-15 NOTE — ED Notes (Signed)
Patient transported to CT via stretcher.

## 2021-04-15 NOTE — Discharge Instructions (Signed)
You have been seen in the Emergency Department (ED) today for pain caused by kidney stones.  As we have discussed, please drink plenty of fluids.  Please make a follow up appointment with the physician(s) listed elsewhere in this documentation.  You may take pain medication as needed but ONLY as prescribed.  Please also take your prescribed Flomax daily.  If you are going to follow up with Urology for possible lithotripsy, please do not take any ibuprofen, naproxen, aspirin, Toradol, or other NSAID after Tuesday morning, as this may exclude you from lithotripsy.  Please see your doctor as soon as possible as stones may take 1-3 weeks to pass and you may require additional care or medications.  Do not drink alcohol, drive or participate in any other potentially dangerous activities while taking opiate pain medication as it may make you sleepy. Do not take this medication with any other sedating medications, either prescription or over-the-counter. If you were prescribed Percocet or Vicodin, do not take these with acetaminophen (Tylenol) as it is already contained within these medications.   Take Percocet as needed for severe pain.  This medication is an opiate (or narcotic) pain medication and can be habit forming.  Use it as little as possible to achieve adequate pain control.  Do not use or use it with extreme caution if you have a history of opiate abuse or dependence.  If you are on a pain contract with your primary care doctor or a pain specialist, be sure to let them know you were prescribed this medication today from the [facility].  This medication is intended for your use only - do not give any to anyone else and keep it in a secure place where nobody else, especially children, have access to it.  It will also cause or worsen constipation, so you may want to consider taking an over-the-counter stool softener while you are taking this medication.  Return to the Emergency  Department (ED) or call your doctor if you have any worsening pain, fever, painful urination, are unable to urinate, or develop other symptoms that concern you.  

## 2021-04-15 NOTE — ED Notes (Signed)
Patient return from CT. MD at the bedside

## 2021-04-15 NOTE — ED Notes (Signed)
MD back at the bedside 

## 2021-04-15 NOTE — ED Notes (Signed)
EKG completed @ [phone]

## 2021-04-15 NOTE — ED Triage Notes (Signed)
Pt to triage via w/c, appears uncomfortable, pale; pt reports awoke at 330 am with left sided abd pain radiating into flank accomp by N/V; denies hx of same

## 2021-04-15 NOTE — ED Provider Notes (Signed)
Vitals:   04/15/21 0700 04/15/21 0730  BP: 126/79 130/74  Pulse: 71 72  Resp: 17 15  Temp:    SpO2: 97% 99%   Patient is resting without distress.  Discussed with her and her pain is well controlled.  She is not have any fever no white count no signs or symptoms suggest sepsis.  Urinalysis sent for culture, it is somewhat concerning for infection.  Discussed with Dr. Vanna Scotland, advises and agreeable with dose of Rocephin in the ER outpatient Keflex and follow-up with urology in 1 week.  Careful return precautions around kidney stone return precautions as well as signs and symptoms of infection discussed with both patient and her husband who are in agreement.  Cephalexin prescription sent to pharmacy   Sharyn Creamer, MD 04/15/21 [phone]

## 2021-04-15 NOTE — Telephone Encounter (Signed)
Contacted by Dr. Fanny Bien regarding this patient.  She is a 4 mm left distal ureteral calculus which is obstructing with a suspicious urinalysis but otherwise no leukocytosis, afebrile, normotensive, nontachycardic and is quite reliable.  She is comfortable.  As such, plan for discharge with antibiotics, strict return precautions and outpatient follow-up with her next week.  Agreeable with this plan.  Will help facilitate appropriate follow up.  Tina Scotland, MD

## 2021-04-15 NOTE — ED Provider Notes (Signed)
[facility] Emergency Department Provider Note  ____________________________________________   Event Date/Time   First MD Initiated Contact with Patient 04/15/21 [phone]     (approximate)  I have reviewed the triage vital signs and the nursing notes.   HISTORY  Chief Complaint Flank Pain  Level 5 caveat:  history/ROS limited by acute/critical illness  HPI Tina Hill is a 45 y.o. female with medical history as listed below who presents for evaluation of acute onset and severe pain in her left lower quadrant that radiates to the back.  She says she had a normal day yesterday with no pain or discomfort when she went to bed but then awoke with severe sharp stabbing pain associated with nausea and vomiting.  Nothing in particular makes it better or worse.  The pain is severe.  She says there is no chance she could be pregnant.  She denies recent dysuria.  No history of kidney stones.  She has been diagnosed with ovarian cyst in the past but has never been told they are particularly large or that she has torsion.     Past Medical History:  Diagnosis Date   Asthma    Family history of breast cancer    Family history of ovarian cancer    Ovarian cyst    Pap smear abnormality of cervix with LGSIL     Patient Active Problem List   Diagnosis Date Noted   Chest pain 09/22/2012   CONSTIPATION 10/25/2008   NONSPECIFIC ABN FINDING RAD & OTH EXAM GI TRACT 10/25/2008    Past Surgical History:  Procedure Laterality Date   COLPOSCOPY     none      Prior to Admission medications   Medication Sig Start Date End Date Taking? Authorizing Provider  docusate sodium (COLACE) 100 MG capsule Take 1 tablet once or twice daily as needed for constipation while taking narcotic pain medicine 04/15/21  Yes Loleta Rose, MD  ondansetron (ZOFRAN ODT) 4 MG disintegrating tablet Allow 1-2 tablets to dissolve in your mouth every 8 hours as needed for nausea/vomiting 04/15/21   Yes Loleta Rose, MD  oxyCODONE-acetaminophen (PERCOCET) 5-325 MG tablet Take 2 tablets by mouth every 6 (six) hours as needed for severe pain. 04/15/21  Yes Loleta Rose, MD  tamsulosin (FLOMAX) 0.4 MG CAPS capsule Take 1 tablet by mouth daily until you pass the kidney stone or no longer have symptoms 04/15/21  Yes Loleta Rose, MD    Allergies Macadamia nut oil  Family History  Problem Relation Age of Onset   Breast cancer Mother 78   Hypertension Mother    Hypothyroidism Mother    Ovarian cancer Mother 66   Hypothyroidism Sister     Social History Social History   Tobacco Use   Smoking status: Never   Smokeless tobacco: Never  Vaping Use   Vaping Use: Never used  Substance Use Topics   Alcohol use: Yes   Drug use: Never    Review of Systems Level 5 caveat:  history/ROS limited by acute/critical illness   Constitutional: No fever/chills Eyes: No visual changes. ENT: No sore throat. Cardiovascular: Denies chest pain. Respiratory: Denies shortness of breath. Gastrointestinal: Severe sharp left-sided abdominal and flank pain associated with nausea and vomiting. Genitourinary: Negative for dysuria. Musculoskeletal: Left flank pain or pain radiating from the left side of the abdomen into the left side of the back. Integumentary: Negative for rash. Neurological: Negative for headaches, focal weakness or numbness.   ____________________________________________   PHYSICAL  EXAM:  VITAL SIGNS: ED Triage Vitals  Enc Vitals Group     BP 04/15/21 0505 (!) 159/92     Pulse Rate 04/15/21 0505 87     Resp 04/15/21 0505 20     Temp 04/15/21 0505 98.1 F (36.7 C)     Temp Source 04/15/21 0505 Oral     SpO2 04/15/21 0505 100 %     Weight 04/15/21 0502 81.6 kg (180 lb)     Height 04/15/21 0502 1.626 m (5\' 4" )     Head Circumference --      Peak Flow --      Pain Score 04/15/21 0502 10     Pain Loc --      Pain Edu? --      Excl. in GC? --     Constitutional: Alert  and oriented.  Appears very uncomfortable. Eyes: Conjunctivae are normal.  Head: Atraumatic. Nose: No congestion/rhinnorhea. Mouth/Throat: Patient is wearing a mask. Neck: No stridor.  No meningeal signs.   Cardiovascular: Normal rate, regular rhythm. Good peripheral circulation. Respiratory: Normal respiratory effort.  No retractions. Gastrointestinal: Soft and nondistended.  Left-sided tenderness to palpation. Musculoskeletal: Left flank tenderness to percussion. Neurologic:  Normal speech and language. No gross focal neurologic deficits are appreciated.  Skin:  Skin is warm, dry and intact. Psychiatric: Mood and affect are normal. Speech and behavior are normal.  ____________________________________________   LABS (all labs ordered are listed, but only abnormal results are displayed)  Labs Reviewed  URINALYSIS, ROUTINE W REFLEX MICROSCOPIC - Abnormal; Notable for the following components:      Result Value   Color, Urine YELLOW (*)    APPearance HAZY (*)    Hgb urine dipstick LARGE (*)    Leukocytes,Ua MODERATE (*)    Bacteria, UA MANY (*)    All other components within normal limits  COMPREHENSIVE METABOLIC PANEL - Abnormal; Notable for the following components:   Potassium 3.4 (*)    Glucose, Bld 112 (*)    Calcium 8.8 (*)    All other components within normal limits  CBC  LIPASE, BLOOD  POC URINE PREG, ED   ____________________________________________  EKG  ED ECG REPORT I, 04/17/21, the attending physician, personally viewed and interpreted this ECG.  Date: 04/15/2021 EKG Time: 5:08 AM Rate: 80 Rhythm: normal sinus rhythm QRS Axis: normal Intervals: normal ST/T Wave abnormalities: Non-specific ST segment / T-wave changes, but no clear evidence of acute ischemia. Narrative Interpretation: no definitive evidence of acute ischemia; does not meet STEMI criteria.  ____________________________________________  RADIOLOGY I, 04/17/2021, personally viewed  and evaluated these images (plain radiographs) as part of my medical decision making, as well as reviewing the written report by the radiologist.  ED MD interpretation: 4 mm distal left ureteral stone with hydroureteronephrosis.  Official radiology report(s): CT Renal Stone Study  Result Date: 04/15/2021 CLINICAL DATA:  Flank pain with kidney stone suspected. EXAM: CT ABDOMEN AND PELVIS WITHOUT CONTRAST TECHNIQUE: Multidetector CT imaging of the abdomen and pelvis was performed following the standard protocol without IV contrast. COMPARISON:  10/02/2008 FINDINGS: Lower chest:  No contributory findings. Hepatobiliary: No focal liver abnormality.No evidence of biliary obstruction or stone. Pancreas: Unremarkable. Spleen: Unremarkable. Adrenals/Urinary Tract: Negative adrenals. Left hydroureteronephrosis and mild low-density renal expansion due to a distal ureteral stone measuring 3 x 4 mm. 2 mm left lower pole calculus. Unremarkable bladder. Stomach/Bowel:  No obstruction. No appendicitis. Vascular/Lymphatic: No acute vascular abnormality. No mass or adenopathy. Reproductive:No pathologic findings. Other:  No ascites or pneumoperitoneum. Musculoskeletal: No acute abnormalities. IMPRESSION: 1. Mild left hydroureteronephrosis from a 4 mm distal ureteral calculus. 2. 2 mm left renal calculus. Electronically Signed   By: Marnee Spring M.D.   On: 04/15/2021 06:29    ____________________________________________   PROCEDURES   Procedure(s) performed (including Critical Care):  Procedures   ____________________________________________   INITIAL IMPRESSION / MDM / ASSESSMENT AND PLAN / ED COURSE  As part of my medical decision making, I reviewed the following data within the electronic MEDICAL RECORD NUMBER History obtained from family, Nursing notes reviewed and incorporated, Labs reviewed , Old chart reviewed, Notes from prior ED visits, and [place] Controlled Substance Database   Differential diagnosis  includes, but is not limited to, ureteral/renal colic, ovarian torsion, UTI/pyelonephritis.  The patient is on the cardiac monitor to evaluate for evidence of arrhythmia and/or significant heart rate changes.  Nonischemic EKG.  Reassuring vitals other than some hypertension and mild tachypnea which are likely secondary to her pain.  Her presentation is very much consistent with ureteral colic although I must be concerned about ovarian torsion as well.  However, in spite of the pain she is experiencing, I think it is more likely that this is a kidney stone rather than ovarian torsion.  I discussed this possibility with her and her husband and and explained that we will likely start with a CT scan but we will evaluate with an ultrasound if the CT is unremarkable.  Labs are pending.  I am treating with morphine 4 mg IV, Toradol 15 mg IV, Zofran 4 mg IV.  CT renal stone protocol pending.  Patient says that there is no chance she could be pregnant and I passed this along to the CT technologist, Minerva Areola, so that we could get the images as early as possible in case this turns out to be ovarian and time becomes more of an issue.     Clinical Course as of 04/15/21 3614  Wed Apr 15, 2021  4315 CT Renal Stone Study 4 mm distal ureteral stone.  I reassessed the patient and she feels much better.  She still has mild pain occasionally but feels like a different person compared to before.  I had my usual and customary kidney stone discussion with the patient and her husband and they agree with the plan.  She is going to give [place] a urine specimen and is allowed to drink fluids.  The oncoming doctor, Dr. Fanny Bien, will review the urine specimen and treat appropriately and the patient should be able to be discharged for outpatient follow-up and conservative management.  I gave her and her husband my usual and customary return precautions. [CF]    Clinical Course User Index [CF] Loleta Rose, MD      ____________________________________________  FINAL CLINICAL IMPRESSION(S) / ED DIAGNOSES  Final diagnoses:  Ureteral colic  Kidney stone     MEDICATIONS GIVEN DURING THIS VISIT:  Medications  morphine 4 MG/ML injection 4 mg (4 mg Intravenous Given 04/15/21 0529)  ondansetron (ZOFRAN) injection 4 mg (4 mg Intravenous Given 04/15/21 0528)  ketorolac (TORADOL) 30 MG/ML injection 15 mg (15 mg Intravenous Given 04/15/21 0528)     ED Discharge Orders          Ordered    oxyCODONE-acetaminophen (PERCOCET) 5-325 MG tablet  Every 6 hours PRN        04/15/21 0753    ondansetron (ZOFRAN ODT) 4 MG disintegrating tablet        04/15/21 0753  tamsulosin (FLOMAX) 0.4 MG CAPS capsule        04/15/21 0753    docusate sodium (COLACE) 100 MG capsule        04/15/21 0753             Note:  This document was prepared using Dragon voice recognition software and may include unintentional dictation errors.   Loleta RoseForbach, [place], MD 04/15/21 [phone]

## 2021-04-17 LAB — URINE CULTURE: Culture: >=100000 — AB

## 2021-04-22 NOTE — Telephone Encounter (Signed)
I have tried to reach patient 3 times. Unable to reach pt for new pt.

## 2021-10-22 DIAGNOSIS — R0789 Other chest pain: Secondary | ICD-10-CM | POA: Diagnosis not present

## 2021-10-22 DIAGNOSIS — R079 Chest pain, unspecified: Secondary | ICD-10-CM | POA: Diagnosis not present

## 2022-01-22 ENCOUNTER — Ambulatory Visit: Payer: Self-pay | Admitting: Nurse Practitioner

## 2022-02-15 ENCOUNTER — Telehealth: Payer: Self-pay | Admitting: Nurse Practitioner

## 2022-02-15 ENCOUNTER — Encounter: Payer: Self-pay | Admitting: Nurse Practitioner

## 2022-02-15 ENCOUNTER — Ambulatory Visit (INDEPENDENT_AMBULATORY_CARE_PROVIDER_SITE_OTHER): Payer: 59 | Admitting: Nurse Practitioner

## 2022-02-15 VITALS — BP 118/80 | HR 73 | Temp 98.0°F | Resp 16 | Ht 64.0 in | Wt 216.4 lb

## 2022-02-15 DIAGNOSIS — Z131 Encounter for screening for diabetes mellitus: Secondary | ICD-10-CM | POA: Diagnosis not present

## 2022-02-15 DIAGNOSIS — Z7689 Persons encountering health services in other specified circumstances: Secondary | ICD-10-CM | POA: Diagnosis not present

## 2022-02-15 DIAGNOSIS — R69 Illness, unspecified: Secondary | ICD-10-CM | POA: Diagnosis not present

## 2022-02-15 DIAGNOSIS — Z114 Encounter for screening for human immunodeficiency virus [HIV]: Secondary | ICD-10-CM | POA: Diagnosis not present

## 2022-02-15 DIAGNOSIS — E039 Hypothyroidism, unspecified: Secondary | ICD-10-CM

## 2022-02-15 DIAGNOSIS — Z1231 Encounter for screening mammogram for malignant neoplasm of breast: Secondary | ICD-10-CM

## 2022-02-15 DIAGNOSIS — R232 Flushing: Secondary | ICD-10-CM | POA: Diagnosis not present

## 2022-02-15 DIAGNOSIS — L659 Nonscarring hair loss, unspecified: Secondary | ICD-10-CM | POA: Diagnosis not present

## 2022-02-15 DIAGNOSIS — R002 Palpitations: Secondary | ICD-10-CM | POA: Diagnosis not present

## 2022-02-15 DIAGNOSIS — Z13220 Encounter for screening for lipoid disorders: Secondary | ICD-10-CM

## 2022-02-15 DIAGNOSIS — E669 Obesity, unspecified: Secondary | ICD-10-CM | POA: Diagnosis not present

## 2022-02-15 DIAGNOSIS — Z1159 Encounter for screening for other viral diseases: Secondary | ICD-10-CM

## 2022-02-15 MED ORDER — LEVOTHYROXINE SODIUM 25 MCG PO TABS: 37.5000 ug | ORAL_TABLET | Freq: Every day | ORAL | Status: DC

## 2022-02-15 NOTE — Progress Notes (Signed)
BP 118/80   Pulse 73   Temp 98 F (36.7 C) (Oral)   Resp 16   Ht 5\' 4"  (1.626 m)   Wt 216 lb 6.4 oz (98.2 kg)   LMP 01/24/2022 (Approximate)   SpO2 98%   BMI 37.14 kg/m    Subjective:    Patient ID: 03/26/2022, female    DOB: 04/16/1976, 46 y.o.   MRN: [id number]  HPI: Tina Hill is a 46 y.o. female  Chief Complaint  Patient presents with   Establish Care   Establish Care: She reports her last physical was a long time ago.  She has a history of hypothyroidism.  Her last Pap was 06/06/2019. She says her last mammogram was about five years ago. She says she did do a cologuard the end of last year.  Which was negative.  She is going to request those results.   Hypothyroidism: She says she is currently taking levothyroxine 37.5 mcg daily.  She says that she has noticed hair loss, palpitations and difficulty losing weight.   Palpitations: Patient denies any chest pain or shortness of breath.  Patient states every once while she feels her heart beating in her chest.  She says this is never been an issue for her.  We will be getting labs.  Hair loss: Patient reports that lately she has noticed that she has been having a lot of hair loss.  She says this happened before with her thyroid.  We will get labs today.  Hot flashes: Patient reports that she has been having hot flashes.  She does still have a menstrual cycle every month.  Discussed starting black cohosh.  Obesity: Patient's current weight is 216 pounds with a BMI of 37.14. She says her highest weight was 220 lbs.  She says that she eats fairly healthy.   Relevant past medical, surgical, family and social history reviewed and updated as indicated. Interim medical history since our last visit reviewed. Allergies and medications reviewed and updated.  Review of Systems  Constitutional: Negative for fever or weight change.  Respiratory: Negative for cough and shortness of breath.   Cardiovascular: Negative for  chest pain, positive for palpitations.  Gastrointestinal: Negative for abdominal pain, no bowel changes.  Musculoskeletal: Negative for gait problem or joint swelling.  Skin: Negative for rash.  Neurological: Negative for dizziness or headache.  No other specific complaints in a complete review of systems (except as listed in HPI above).      Objective:    BP 118/80   Pulse 73   Temp 98 F (36.7 C) (Oral)   Resp 16   Ht 5\' 4"  (1.626 m)   Wt 216 lb 6.4 oz (98.2 kg)   LMP 01/24/2022 (Approximate)   SpO2 98%   BMI 37.14 kg/m   Wt Readings from Last 3 Encounters:  02/15/22 216 lb 6.4 oz (98.2 kg)  04/15/21 180 lb (81.6 kg)  06/27/19 189 lb (85.7 kg)    Physical Exam  Constitutional: Patient appears well-developed and well-nourished. Obese  No distress.  HEENT: head atraumatic, normocephalic, pupils equal and reactive to light, neck supple Cardiovascular: Normal rate, regular rhythm and normal heart sounds.  No murmur heard. No BLE edema. Pulmonary/Chest: Effort normal and breath sounds normal. No respiratory distress. Abdominal: Soft.  There is no tenderness. Psychiatric: Patient has a normal mood and affect. behavior is normal. Judgment and thought content normal.   Results for orders placed or performed during the hospital encounter of  04/15/21  Urine Culture   Specimen: Urine, Clean Catch  Result Value Ref Range   Specimen Description      URINE, CLEAN CATCH Performed at [facility], [address] [place], [place] [address]    Special Requests      NONE Performed at [facility], [address] [place], [place] [address]    Culture >=100,000 COLONIES/mL PROTEUS MIRABILIS (A)    Report Status 04/17/2021 FINAL    Organism ID, Bacteria PROTEUS MIRABILIS (A)       Susceptibility   Proteus mirabilis - MIC*    AMPICILLIN <=2 SENSITIVE Sensitive     CEFAZOLIN <=4 SENSITIVE Sensitive     CEFEPIME <=0.12 SENSITIVE Sensitive     CEFTRIAXONE <=0.25  SENSITIVE Sensitive     CIPROFLOXACIN <=0.25 SENSITIVE Sensitive     GENTAMICIN <=1 SENSITIVE Sensitive     IMIPENEM 2 SENSITIVE Sensitive     NITROFURANTOIN RESISTANT Resistant     TRIMETH/SULFA <=20 SENSITIVE Sensitive     AMPICILLIN/SULBACTAM <=2 SENSITIVE Sensitive     PIP/TAZO <=4 SENSITIVE Sensitive     * >=100,000 COLONIES/mL PROTEUS MIRABILIS  CBC  Result Value Ref Range   WBC 10.4 4.0 - 10.5 K/uL   RBC 4.23 3.87 - 5.11 MIL/uL   Hemoglobin 13.6 12.0 - 15.0 g/dL   HCT 38.7 36.0 - 46.0 %   MCV 91.5 80.0 - 100.0 fL   MCH 32.2 26.0 - 34.0 pg   MCHC 35.1 30.0 - 36.0 g/dL   RDW 12.1 11.5 - 15.5 %   Platelets 292 150 - 400 K/uL   nRBC 0.0 0.0 - 0.2 %  Urinalysis, Routine w reflex microscopic  Result Value Ref Range   Color, Urine YELLOW (A) YELLOW   APPearance HAZY (A) CLEAR   Specific Gravity, Urine 1.013 1.005 - 1.030   pH 6.0 5.0 - 8.0   Glucose, UA NEGATIVE NEGATIVE mg/dL   Hgb urine dipstick LARGE (A) NEGATIVE   Bilirubin Urine NEGATIVE NEGATIVE   Ketones, ur NEGATIVE NEGATIVE mg/dL   Protein, ur NEGATIVE NEGATIVE mg/dL   Nitrite NEGATIVE NEGATIVE   Leukocytes,Ua MODERATE (A) NEGATIVE   RBC / HPF 11-20 0 - 5 RBC/hpf   WBC, UA 21-50 0 - 5 WBC/hpf   Bacteria, UA MANY (A) NONE SEEN   Squamous Epithelial / LPF 0-5 0 - 5   Mucus PRESENT   Comprehensive metabolic panel  Result Value Ref Range   Sodium 136 135 - 145 mmol/L   Potassium 3.4 (L) 3.5 - 5.1 mmol/L   Chloride 105 98 - 111 mmol/L   CO2 23 22 - 32 mmol/L   Glucose, Bld 112 (H) 70 - 99 mg/dL   BUN 14 6 - 20 mg/dL   Creatinine, Ser 0.90 0.44 - 1.00 mg/dL   Calcium 8.8 (L) 8.9 - 10.3 mg/dL   Total Protein 7.9 6.5 - 8.1 g/dL   Albumin 3.8 3.5 - 5.0 g/dL   AST 20 15 - 41 U/L   ALT 16 0 - 44 U/L   Alkaline Phosphatase 74 38 - 126 U/L   Total Bilirubin 0.9 0.3 - 1.2 mg/dL   GFR, Estimated >60 >60 mL/min   Anion gap 8 5 - 15  Lipase, blood  Result Value Ref Range   Lipase 34 11 - 51 U/L  POC Urine  Pregnancy, ED  Result Value Ref Range   Preg Test, Ur Negative Negative      Assessment & Plan:   Problem  List Items Addressed This Visit       Endocrine   Hypothyroidism   Relevant Medications   levothyroxine (SYNTHROID) 25 MCG tablet   Other Relevant Orders   TSH     Other   Obesity (BMI 30-39.9) - Primary   Relevant Orders   Lipid panel   CBC with Differential/Platelet   COMPLETE METABOLIC PANEL WITH GFR   Hemoglobin A1c   Other Visit Diagnoses     Encounter for screening for HIV       Relevant Orders   HIV Antibody (routine testing w rflx)   Encounter for hepatitis C screening test for low risk patient       Relevant Orders   Hepatitis C antibody   Encounter for screening mammogram for malignant neoplasm of breast       Relevant Orders   MM 3D SCREEN BREAST BILATERAL   Hair loss       Patient reports hair loss.  We will get labs.   Relevant Orders   TSH   VITAMIN D 25 Hydroxy (Vit-D Deficiency, Fractures)   Vitamin B12   Palpitations       Patient reports palpitations.  She denies any chest pain or shortness of breath.  We will get lab work.   Relevant Orders   CBC with Differential/Platelet   COMPLETE METABOLIC PANEL WITH GFR   TSH   Screening for cholesterol level       Relevant Orders   Lipid panel   Screening for diabetes mellitus       Relevant Orders   Hemoglobin A1c   Encounter to establish care       Schedule CPE   Hot flashes       Try using black cohosh (estoven)        Follow up plan: Return in about 4 months (around 06/18/2022) for cpe.

## 2022-02-15 NOTE — Telephone Encounter (Signed)
Copied from CRM [id number]. Topic: General - Other >> Feb 15, 2022 10:36 AM Turkey B wrote: Reason for CRM: Patient called in asking if a request can  be sent over from [facility] labs, done today because they are charging  her there. She says was told by [place] labs it can be transferred to [place] for [facility] so she won't have to come out of pocket. Please cal back

## 2022-02-15 NOTE — Telephone Encounter (Signed)
Spoke to patient  she want the office to request colonoscopyy from [facility] [phone]

## 2022-02-15 NOTE — Patient Instructions (Signed)
Estroven    Black Cohosh

## 2022-02-17 LAB — COMPLETE METABOLIC PANEL WITH GFR
AG Ratio: 1.4 (calc) (ref 1.0–2.5)
ALT: 14 U/L (ref 6–29)
AST: 17 U/L (ref 10–35)
Albumin: 4.1 g/dL (ref 3.6–5.1)
Alkaline phosphatase (APISO): 69 U/L (ref 31–125)
BUN: 14 mg/dL (ref 7–25)
CO2: 26 mmol/L (ref 20–32)
Calcium: 9.4 mg/dL (ref 8.6–10.2)
Chloride: 107 mmol/L (ref 98–110)
Creat: 0.78 mg/dL (ref 0.50–0.99)
Globulin: 3 g/dL (calc) (ref 1.9–3.7)
Glucose, Bld: 81 mg/dL (ref 65–99)
Potassium: 4.4 mmol/L (ref 3.5–5.3)
Sodium: 139 mmol/L (ref 135–146)
Total Bilirubin: 0.8 mg/dL (ref 0.2–1.2)
Total Protein: 7.1 g/dL (ref 6.1–8.1)
eGFR: 95 mL/min/{1.73_m2} (ref >=60)

## 2022-02-17 LAB — VITAMIN B12: Vitamin B-12: 265 pg/mL (ref 200–1100)

## 2022-02-17 LAB — CBC WITH DIFFERENTIAL/PLATELET
Absolute Monocytes: 605 cells/uL (ref 200–950)
Basophils Absolute: 58 cells/uL (ref 0–200)
Basophils Relative: 0.8 %
Eosinophils Absolute: 641 cells/uL — ABNORMAL HIGH (ref 15–500)
Eosinophils Relative: 8.9 %
HCT: 39.7 % (ref 35.0–45.0)
Hemoglobin: 13.2 g/dL (ref 11.7–15.5)
Lymphs Abs: 2282 cells/uL (ref 850–3900)
MCH: 30.1 pg (ref 27.0–33.0)
MCHC: 33.2 g/dL (ref 32.0–36.0)
MCV: 90.4 fL (ref 80.0–100.0)
MPV: 11.2 fL (ref 7.5–12.5)
Monocytes Relative: 8.4 %
Neutro Abs: 3614 cells/uL (ref 1500–7800)
Neutrophils Relative %: 50.2 %
Platelets: 316 10*3/uL (ref 140–400)
RBC: 4.39 10*6/uL (ref 3.80–5.10)
RDW: 11.9 % (ref 11.0–15.0)
Total Lymphocyte: 31.7 %
WBC: 7.2 10*3/uL (ref 3.8–10.8)

## 2022-02-17 LAB — TSH: TSH: 2.82 mIU/L

## 2022-02-17 LAB — HIV ANTIBODY (ROUTINE TESTING W REFLEX): HIV 1&2 Ab, 4th Generation: NONREACTIVE

## 2022-02-17 LAB — LIPID PANEL
Cholesterol: 179 mg/dL (ref <200)
HDL: 58 mg/dL (ref >=50)
LDL Cholesterol (Calc): 101 mg/dL (calc) — ABNORMAL HIGH
Non-HDL Cholesterol (Calc): 121 mg/dL (calc) (ref <130)
Total CHOL/HDL Ratio: 3.1 (calc) (ref <5.0)
Triglycerides: 107 mg/dL (ref <150)

## 2022-02-17 LAB — HEMOGLOBIN A1C
Hgb A1c MFr Bld: 5.2 % of total Hgb (ref <5.7)
Mean Plasma Glucose: 103 mg/dL
eAG (mmol/L): 5.7 mmol/L

## 2022-02-17 LAB — VITAMIN D 25 HYDROXY (VIT D DEFICIENCY, FRACTURES): Vit D, 25-Hydroxy: 40 ng/mL (ref 30–100)

## 2022-02-17 LAB — HEPATITIS C ANTIBODY: Hepatitis C Ab: NONREACTIVE

## 2022-04-23 ENCOUNTER — Ambulatory Visit
Admission: RE | Admit: 2022-04-23 | Discharge: 2022-04-23 | Disposition: A | Discharge status: Home / Self Care | Payer: 59 | Source: Ambulatory Visit | Attending: Nurse Practitioner | Admitting: Nurse Practitioner

## 2022-04-23 DIAGNOSIS — Z1231 Encounter for screening mammogram for malignant neoplasm of breast: Secondary | ICD-10-CM | POA: Diagnosis not present

## 2022-04-28 ENCOUNTER — Ambulatory Visit
Admission: RE | Admit: 2022-04-28 | Discharge: 2022-04-28 | Disposition: A | Discharge status: Home / Self Care | Payer: 59 | Source: Ambulatory Visit | Attending: Nurse Practitioner | Admitting: Nurse Practitioner

## 2022-04-28 ENCOUNTER — Encounter: Payer: Self-pay | Admitting: Nurse Practitioner

## 2022-04-28 ENCOUNTER — Ambulatory Visit (INDEPENDENT_AMBULATORY_CARE_PROVIDER_SITE_OTHER): Payer: 59 | Admitting: Nurse Practitioner

## 2022-04-28 ENCOUNTER — Other Ambulatory Visit
Admission: RE | Admit: 2022-04-28 | Discharge: 2022-04-28 | Disposition: A | Discharge status: Home / Self Care | Payer: 59 | Source: Home / Self Care | Attending: Nurse Practitioner | Admitting: Nurse Practitioner

## 2022-04-28 VITALS — BP 120/72 | HR 93 | Temp 98.6°F | Resp 16 | Ht 64.0 in | Wt 214.5 lb

## 2022-04-28 DIAGNOSIS — K7689 Other specified diseases of liver: Secondary | ICD-10-CM | POA: Diagnosis not present

## 2022-04-28 DIAGNOSIS — N200 Calculus of kidney: Secondary | ICD-10-CM | POA: Diagnosis not present

## 2022-04-28 DIAGNOSIS — R1032 Left lower quadrant pain: Secondary | ICD-10-CM | POA: Insufficient documentation

## 2022-04-28 DIAGNOSIS — D259 Leiomyoma of uterus, unspecified: Secondary | ICD-10-CM | POA: Diagnosis not present

## 2022-04-28 DIAGNOSIS — K429 Umbilical hernia without obstruction or gangrene: Secondary | ICD-10-CM | POA: Diagnosis not present

## 2022-04-28 LAB — CBC WITH DIFFERENTIAL/PLATELET
Abs Immature Granulocytes: 0.02 10*3/uL (ref 0.00–0.07)
Basophils Absolute: 0.1 10*3/uL (ref 0.0–0.1)
Basophils Relative: 1 %
Eosinophils Absolute: 0.5 10*3/uL (ref 0.0–0.5)
Eosinophils Relative: 5 %
HCT: 38.8 % (ref 36.0–46.0)
Hemoglobin: 12.9 g/dL (ref 12.0–15.0)
Immature Granulocytes: 0 %
Lymphocytes Relative: 26 %
Lymphs Abs: 2.5 10*3/uL (ref 0.7–4.0)
MCH: 29.3 pg (ref 26.0–34.0)
MCHC: 33.2 g/dL (ref 30.0–36.0)
MCV: 88.2 fL (ref 80.0–100.0)
Monocytes Absolute: 0.8 10*3/uL (ref 0.1–1.0)
Monocytes Relative: 8 %
Neutro Abs: 5.8 10*3/uL (ref 1.7–7.7)
Neutrophils Relative %: 60 %
Platelets: 300 10*3/uL (ref 150–400)
RBC: 4.4 MIL/uL (ref 3.87–5.11)
RDW: 12.2 % (ref 11.5–15.5)
WBC: 9.6 10*3/uL (ref 4.0–10.5)
nRBC: 0 % (ref 0.0–0.2)

## 2022-04-28 LAB — COMPREHENSIVE METABOLIC PANEL
ALT: 15 U/L (ref 0–44)
AST: 18 U/L (ref 15–41)
Albumin: 3.8 g/dL (ref 3.5–5.0)
Alkaline Phosphatase: 73 U/L (ref 38–126)
Anion gap: 9 (ref 5–15)
BUN: 11 mg/dL (ref 6–20)
CO2: 23 mmol/L (ref 22–32)
Calcium: 9.1 mg/dL (ref 8.9–10.3)
Chloride: 105 mmol/L (ref 98–111)
Creatinine, Ser: 0.67 mg/dL (ref 0.44–1.00)
GFR, Estimated: >60 mL/min (ref >60)
Glucose, Bld: 89 mg/dL (ref 70–99)
Potassium: 4.1 mmol/L (ref 3.5–5.1)
Sodium: 137 mmol/L (ref 135–145)
Total Bilirubin: 1.1 mg/dL (ref 0.3–1.2)
Total Protein: 7.9 g/dL (ref 6.5–8.1)

## 2022-04-28 LAB — POCT URINALYSIS DIPSTICK
Bilirubin, UA: NEGATIVE
Glucose, UA: NEGATIVE
Ketones, UA: NEGATIVE
Leukocytes, UA: NEGATIVE
Nitrite, UA: NEGATIVE
Protein, UA: POSITIVE — AB
Spec Grav, UA: 1.02 (ref 1.010–1.025)
Urobilinogen, UA: 0.2 E.U./dL
pH, UA: 5 (ref 5.0–8.0)

## 2022-04-28 LAB — POCT URINE PREGNANCY: Preg Test, Ur: NEGATIVE

## 2022-04-28 MED ORDER — IOHEXOL 300 MG/ML  SOLN
100.0000 mL | Freq: Once | INTRAMUSCULAR | Status: AC | PRN
  Administered 2022-04-28: 100 mL via INTRAVENOUS

## 2022-04-28 NOTE — Progress Notes (Signed)
BP 120/72   Pulse 93   Temp 98.6 F (37 C) (Oral)   Resp 16   Ht 5\' 4"  (1.626 m)   Wt 214 lb 8 oz (97.3 kg)   LMP 04/07/2022   SpO2 98%   BMI 36.82 kg/m    Subjective:    Patient ID: 04/09/2022, female    DOB: Mar 02, 1976, 46 y.o.   MRN: [id number]  HPI: Tina Hill is a 46 y.o. female  Chief Complaint  Patient presents with   Abdominal Pain    Lower Leftside onset for 3 weeks sometimes radiates to the back   LLQ abdominal pain: Patient reports over the last two weeks she has had LLQ abdominal pain sharp, comes and goes.  She says it is worse if she bends on that side. She says that she has noticed some changes in her bowel movements sometimes it is diarrhea and other times its soft and no blood in her stool. She says she has had some episodes of nausea. She denies fever, urinary complaints.  LMC was 04/02/2022.  She says that she has had an ovarian cyst on the left side.  She does have tenderness in the LLQ.  Will get stat labs and ct. Will get poc urine preg.  Pregnancy was negative.  Urine dip showed large blood and positive for protein.  Relevant past medical, surgical, family and social history reviewed and updated as indicated. Interim medical history since our last visit reviewed. Allergies and medications reviewed and updated.  Review of Systems  Constitutional: Negative for fever or weight change.  Respiratory: Negative for cough and shortness of breath.   Cardiovascular: Negative for chest pain or palpitations.  Gastrointestinal: positive for abdominal pain, diarrhea and soft stool Musculoskeletal: Negative for gait problem or joint swelling.  Skin: Negative for rash.  Neurological: Negative for dizziness or headache.  No other specific complaints in a complete review of systems (except as listed in HPI above).      Objective:    BP 120/72   Pulse 93   Temp 98.6 F (37 C) (Oral)   Resp 16   Ht 5\' 4"  (1.626 m)   Wt 214 lb 8 oz (97.3 kg)   LMP  04/07/2022   SpO2 98%   BMI 36.82 kg/m   Wt Readings from Last 3 Encounters:  04/28/22 214 lb 8 oz (97.3 kg)  02/15/22 216 lb 6.4 oz (98.2 kg)  04/15/21 180 lb (81.6 kg)    Physical Exam  Constitutional: Patient appears well-developed and well-nourished. Obese  No distress.  HEENT: head atraumatic, normocephalic, pupils equal and reactive to light, neck supple Cardiovascular: Normal rate, regular rhythm and normal heart sounds.  No murmur heard. No BLE edema. Pulmonary/Chest: Effort normal and breath sounds normal. No respiratory distress. Abdominal: Soft.  Tenderness left lower quadrant.  Psychiatric: Patient has a normal mood and affect. behavior is normal. Judgment and thought content normal.  Results for orders placed or performed in visit on 04/28/22  POCT urine pregnancy  Result Value Ref Range   Preg Test, Ur Negative Negative  POCT urinalysis dipstick  Result Value Ref Range   Color, UA gold    Clarity, UA cloudy    Glucose, UA Negative Negative   Bilirubin, UA negative    Ketones, UA negative    Spec Grav, UA 1.020 1.010 - 1.025   Blood, UA large    pH, UA 5.0 5.0 - 8.0   Protein, UA Positive (A)  Negative   Urobilinogen, UA 0.2 0.2 or 1.0 E.U./dL   Nitrite, UA negative    Leukocytes, UA Negative Negative   Appearance cloudy    Odor strong       Assessment & Plan:   Problem List Items Addressed This Visit   None Visit Diagnoses     Left lower quadrant abdominal pain    -  Primary   LLQ abdominal pain, will get stat labs and ct, obtain urine pregnancy test.    Relevant Orders   CT ABDOMEN PELVIS W CONTRAST   POCT urine pregnancy (Completed)   CBC with Differential/Platelet   Comprehensive metabolic panel   POCT urinalysis dipstick (Completed)        Follow up plan: Return if symptoms worsen or fail to improve.

## 2022-06-17 NOTE — Progress Notes (Signed)
Name: Tina Hill   MRN: [id number]    DOB: September 27, 1975   Date:06/18/2022       Progress Note  Subjective  Chief Complaint  Chief Complaint  Patient presents with   Annual Exam    HPI  Patient presents for annual CPE.  Diet: well balanced diet and tries to watch carbs Exercise: house work, goes to gym 3  days a week for 60-90 minutes  Sleep: 7-8 hours Last dental exam:every six months Last eye exam: two years ago  [employer] Visit from 06/18/2022 in [facility]  AUDIT-C Score 0      Depression: Phq 9 is  negative    06/18/2022    9:13 AM 04/28/2022   11:07 AM 02/15/2022    8:33 AM  Depression screen PHQ 2/9  Decreased Interest 0 0 0  Down, Depressed, Hopeless 0 0 0  PHQ - 2 Score 0 0 0  Altered sleeping  0 0  Tired, decreased energy  0 0  Change in appetite  0 0  Feeling bad or failure about yourself   0 0  Trouble concentrating  0 0  Moving slowly or fidgety/restless  0 0  Suicidal thoughts  0 0  PHQ-9 Score  0 0  Difficult doing work/chores  Not difficult at all Not difficult at all   Hypertension: BP Readings from Last 3 Encounters:  06/18/22 118/72  04/28/22 120/72  02/15/22 118/80   Obesity: Wt Readings from Last 3 Encounters:  06/18/22 218 lb 8 oz (99.1 kg)  04/28/22 214 lb 8 oz (97.3 kg)  02/15/22 216 lb 6.4 oz (98.2 kg)   BMI Readings from Last 3 Encounters:  06/18/22 37.51 kg/m  04/28/22 36.82 kg/m  02/15/22 37.14 kg/m     Vaccines:  HPV: up to at age 43 , ask insurance if age between 87-45  Shingrix: 24-64 yo and ask insurance if covered when patient above 70 yo Pneumonia:  educated and discussed with patient. Flu:  educated and discussed with patient.  Hep C Screening: 02/15/2022 STD testing and prevention (HIV/chl/gon/syphilis): 02/15/2022 Intimate partner violence:none Sexual History : yes Menstrual History/LMP/Abnormal Bleeding: LMC: 05/31/2022, regular Incontinence Symptoms: none  Breast cancer:   - Last Mammogram: 04/26/2022 - BRCA gene screening: none  Osteoporosis: Discussed high calcium and vitamin D supplementation, weight bearing exercises  Cervical cancer screening: 06/06/2019  Skin cancer: Discussed monitoring for atypical lesions  Colorectal cancer: she says she had one done last year, will try and get records, she reports it was negative   Lung cancer:   Low Dose CT Chest recommended if Age 22-80 years, 20 pack-year currently smoking OR have quit w/in 15years. Patient does not qualify.   ECG: 04/15/2021  Advanced Care Planning: A voluntary discussion about advance care planning including the explanation and discussion of advance directives.  Discussed health care proxy and Living will, and the patient was able to identify a health care proxy as husband.  Patient does not have a living will at present time. If patient does have living will, I have requested they bring this to the clinic to be scanned in to their chart.  Lipids: Lab Results  Component Value Date   CHOL 179 02/15/2022   Lab Results  Component Value Date   HDL 58 02/15/2022   Lab Results  Component Value Date   LDLCALC 101 (H) 02/15/2022   Lab Results  Component Value Date   TRIG 107 02/15/2022   Lab Results  Component Value Date   CHOLHDL 3.1 02/15/2022   No results found for: "LDLDIRECT"  Glucose: Glucose, Bld  Date Value Ref Range Status  04/28/2022 89 70 - 99 mg/dL Final    Comment:    Glucose reference range applies only to samples taken after fasting for at least 8 hours.  02/15/2022 81 65 - 99 mg/dL Final    Comment:    .            Fasting reference interval .   04/15/2021 112 (H) 70 - 99 mg/dL Final    Comment:    Glucose reference range applies only to samples taken after fasting for at least 8 hours.    Patient Active Problem List   Diagnosis Date Noted   Obesity (BMI 30-39.9) 02/15/2022   Hypothyroidism 02/15/2022    Past Surgical History:  Procedure Laterality  Date   COLPOSCOPY     none      Family History  Problem Relation Age of Onset   Breast cancer Mother 90   Hypertension Mother    Hypothyroidism Mother    Ovarian cancer Mother 33   Hypothyroidism Sister     Social History   Socioeconomic History   Marital status: Married    Spouse name: Not on file   Number of children: Not on file   Years of education: Not on file   Highest education level: Not on file  Occupational History   Not on file  Tobacco Use   Smoking status: Never   Smokeless tobacco: Never  Vaping Use   Vaping Use: Never used  Substance and Sexual Activity   Alcohol use: Yes    Comment: Occasionally   Drug use: Never   Sexual activity: Yes    Birth control/protection: None, Surgical    Comment: Vasectomy  Other Topics Concern   Not on file  Social History Narrative   Not on file   Social Determinants of Health   Financial Resource Strain: Low Risk  (06/18/2022)   Overall Financial Resource Strain (CARDIA)    Difficulty of Paying Living Expenses: Not hard at all  Food Insecurity: No Food Insecurity (06/18/2022)   Hunger Vital Sign    Worried About Running Out of Food in the Last Year: Never true    [place] in the Last Year: Never true  Transportation Needs: No Transportation Needs (06/18/2022)   PRAPARE - Hydrologist (Medical): No    Lack of Transportation (Non-Medical): No  Physical Activity: Insufficiently Active (06/18/2022)   Exercise Vital Sign    Days of Exercise per Week: 1 day    Minutes of Exercise per Session: 20 min  Stress: No Stress Concern Present (06/18/2022)   [place]    Feeling of Stress : Only a little  Social Connections: Moderately Integrated (06/18/2022)   Social Connection and Isolation Panel [NHANES]    Frequency of Communication with Friends and Family: Three times a week    Frequency of Social Gatherings with Friends  and Family: Three times a week    Attends Religious Services: 1 to 4 times per year    Active Member of Clubs or Organizations: No    Attends Archivist Meetings: Never    Marital Status: Married  Human resources officer Violence: Not At Risk (06/18/2022)   Humiliation, Afraid, Rape, and Kick questionnaire    Fear of Current or Ex-Partner: No  Emotionally Abused: No    Physically Abused: No    Sexually Abused: No     Current Outpatient Medications:    levothyroxine (SYNTHROID) 25 MCG tablet, Take 1.5 tablets (37.5 mcg total) by mouth daily before breakfast., Disp: , Rfl:   Allergies  Allergen Reactions   Macadamia Nut Oil     Difficulty breathing     ROS  Constitutional: Negative for fever or weight change.  Respiratory: Negative for cough and shortness of breath.   Cardiovascular: Negative for chest pain or palpitations.  Gastrointestinal: Negative for abdominal pain, no bowel changes.  Musculoskeletal: Negative for gait problem or joint swelling.  Skin: Negative for rash.  Neurological: Negative for dizziness or headache.  No other specific complaints in a complete review of systems (except as listed in HPI above).   Objective  Vitals:   06/18/22 0913  BP: 118/72  Pulse: 93  Resp: 18  Temp: 98.3 F (36.8 C)  TempSrc: Oral  SpO2: 98%  Weight: 218 lb 8 oz (99.1 kg)  Height: _0  (1.626 m)    Body mass index is 37.51 kg/m.  Physical Exam Constitutional: Patient appears well-developed and well-nourished. No distress.  HENT: Head: Normocephalic and atraumatic. Ears: B TMs ok, no erythema or effusion; Nose: Nose normal. Mouth/Throat: Oropharynx is clear and moist. No oropharyngeal exudate.  Eyes: Conjunctivae and EOM are normal. Pupils are equal, round, and reactive to light. No scleral icterus.  Neck: Normal range of motion. Neck supple. No JVD present. No thyromegaly present.  Cardiovascular: Normal rate, regular rhythm and normal heart sounds.  No murmur  heard. No BLE edema. Pulmonary/Chest: Effort normal and breath sounds normal. No respiratory distress. Abdominal: Soft. Bowel sounds are normal, no distension. There is no tenderness. no masses Musculoskeletal: Normal range of motion, no joint effusions. No gross deformities Neurological: he is alert and oriented to person, place, and time. No cranial nerve deficit. Coordination, balance, strength, speech and gait are normal.  Skin: Skin is warm and dry. No rash noted. No erythema.  Psychiatric: Patient has a normal mood and affect. behavior is normal. Judgment and thought content normal.   Recent Results (from the past 2160 hour(s))  POCT urine pregnancy     Status: Normal   Collection Time: 04/28/22 11:51 AM  Result Value Ref Range   Preg Test, Ur Negative Negative  POCT urinalysis dipstick     Status: Abnormal   Collection Time: 04/28/22 11:52 AM  Result Value Ref Range   Color, UA gold    Clarity, UA cloudy    Glucose, UA Negative Negative   Bilirubin, UA negative    Ketones, UA negative    Spec Grav, UA 1.020 1.010 - 1.025   Blood, UA large    pH, UA 5.0 5.0 - 8.0   Protein, UA Positive (A) Negative   Urobilinogen, UA 0.2 0.2 or 1.0 E.U./dL   Nitrite, UA negative    Leukocytes, UA Negative Negative   Appearance cloudy    Odor strong   Comprehensive metabolic panel     Status: None   Collection Time: 04/28/22 12:52 PM  Result Value Ref Range   Sodium 137 135 - 145 mmol/L   Potassium 4.1 3.5 - 5.1 mmol/L   Chloride 105 98 - 111 mmol/L   CO2 23 22 - 32 mmol/L   Glucose, Bld 89 70 - 99 mg/dL    Comment: Glucose reference range applies only to samples taken after fasting for at least 8 hours.  BUN 11 6 - 20 mg/dL   Creatinine, Ser 0.67 0.44 - 1.00 mg/dL   Calcium 9.1 8.9 - 10.3 mg/dL   Total Protein 7.9 6.5 - 8.1 g/dL   Albumin 3.8 3.5 - 5.0 g/dL   AST 18 15 - 41 U/L   ALT 15 0 - 44 U/L   Alkaline Phosphatase 73 38 - 126 U/L   Total Bilirubin 1.1 0.3 - 1.2 mg/dL    GFR, Estimated >60 >60 mL/min    Comment: (NOTE) Calculated using the CKD-EPI Creatinine Equation (2021)    Anion gap 9 5 - 15    Comment: Performed at [facility], [address] [place], [place] [address]  CBC with Differential/Platelet     Status: None   Collection Time: 04/28/22 12:52 PM  Result Value Ref Range   WBC 9.6 4.0 - 10.5 K/uL   RBC 4.40 3.87 - 5.11 MIL/uL   Hemoglobin 12.9 12.0 - 15.0 g/dL   HCT 38.8 36.0 - 46.0 %   MCV 88.2 80.0 - 100.0 fL   MCH 29.3 26.0 - 34.0 pg   MCHC 33.2 30.0 - 36.0 g/dL   RDW 12.2 11.5 - 15.5 %   Platelets 300 150 - 400 K/uL   nRBC 0.0 0.0 - 0.2 %   Neutrophils Relative % 60 %   Neutro Abs 5.8 1.7 - 7.7 K/uL   Lymphocytes Relative 26 %   Lymphs Abs 2.5 0.7 - 4.0 K/uL   Monocytes Relative 8 %   Monocytes Absolute 0.8 0.1 - 1.0 K/uL   Eosinophils Relative 5 %   Eosinophils Absolute 0.5 0.0 - 0.5 K/uL   Basophils Relative 1 %   Basophils Absolute 0.1 0.0 - 0.1 K/uL   Immature Granulocytes 0 %   Abs Immature Granulocytes 0.02 0.00 - 0.07 K/uL    Comment: Performed at [facility], [address] [place], [place] [address]     Fall Risk:    06/18/2022    9:13 AM 04/28/2022   11:07 AM 02/15/2022    8:32 AM  [place] in the past year? 0 0 0  Number falls in past yr: 0 0 0  Injury with Fall? 0 0 0  Risk for fall due to :  No Fall Risks No Fall Risks  Follow up Falls evaluation completed Falls prevention discussed;Education provided Falls prevention discussed;Education provided     Functional Status Survey: Is the patient deaf or have difficulty hearing?: No Does the patient have difficulty seeing, even when wearing glasses/contacts?: No Does the patient have difficulty concentrating, remembering, or making decisions?: No Does the patient have difficulty walking or climbing stairs?: No Does the patient have difficulty dressing or bathing?: No Does the patient have difficulty doing errands alone such as  visiting a doctor's office or shopping?: No   Assessment & Plan  1. Annual physical exam She is up to date on labs She is up to date on mammogram She is up to date on colorectal cancer screening, pending records  -USPSTF grade A and B recommendations reviewed with patient; age-appropriate recommendations, preventive care, screening tests, etc discussed and encouraged; healthy living encouraged; see AVS for patient education given to patient -Discussed importance of 150 minutes of physical activity weekly, eat two servings of fish weekly, eat one serving of tree nuts ( cashews, pistachios, pecans, almonds.Marland Kitchen) every other day, eat 6 servings of fruit/vegetables daily and drink plenty of water and avoid sweet beverages.

## 2022-06-18 ENCOUNTER — Other Ambulatory Visit: Payer: Self-pay

## 2022-06-18 ENCOUNTER — Encounter: Payer: Self-pay | Admitting: Nurse Practitioner

## 2022-06-18 ENCOUNTER — Ambulatory Visit (INDEPENDENT_AMBULATORY_CARE_PROVIDER_SITE_OTHER): Payer: 59 | Admitting: Nurse Practitioner

## 2022-06-18 VITALS — BP 118/72 | HR 93 | Temp 98.3°F | Resp 18 | Ht 64.0 in | Wt 218.5 lb

## 2022-06-18 DIAGNOSIS — Z0000 Encounter for general adult medical examination without abnormal findings: Secondary | ICD-10-CM | POA: Diagnosis not present

## 2022-07-15 ENCOUNTER — Other Ambulatory Visit: Payer: Self-pay | Admitting: Nurse Practitioner

## 2022-07-15 MED ORDER — LEVOTHYROXINE SODIUM 25 MCG PO TABS: 37.5000 ug | ORAL_TABLET | Freq: Every day | ORAL | 2 refills | Status: DC

## 2022-07-15 NOTE — Telephone Encounter (Signed)
Medication Refill - Medication: levothyroxine (SYNTHROID) 25 MCG tablet   Has the patient contacted their pharmacy? Yes.   No, more refills.   (Agent: If no, request that the patient contact the pharmacy for the refill. If patient does not wish to contact the pharmacy document the reason why and proceed with request.)   Preferred Pharmacy (with phone number or street name):  [id number] [place], [place] - [address] AT [facility] [address]  [address] [place] [address]  Phone: [phone] Fax: [phone]  Hours: Not open 24 hours   Has the patient been seen for an appointment in the last year OR does the patient have an upcoming appointment? Yes.    Agent: Please be advised that RX refills may take up to 3 business days. We ask that you follow-up with your pharmacy.

## 2022-07-15 NOTE — Telephone Encounter (Signed)
Requested Prescriptions  Pending Prescriptions Disp Refills   levothyroxine (SYNTHROID) 25 MCG tablet 135 tablet 2    Sig: Take 1.5 tablets (37.5 mcg total) by mouth daily before breakfast.     Endocrinology:  Hypothyroid Agents Passed - 07/15/2022 10:06 AM      Passed - TSH in normal range and within 360 days    TSH  Date Value Ref Range Status  02/15/2022 2.82 mIU/L Final    Comment:              Reference Range .           > or = 20 Years  0.40-4.50 .                Pregnancy Ranges           First trimester    0.26-2.66           Second trimester   0.55-2.73           Third trimester    0.43-2.91          Passed - Valid encounter within last 12 months    Recent Outpatient Visits           3 weeks ago Annual physical exam   [facility] Della Goo F, FNP   2 months ago Left lower quadrant abdominal pain   [facility] Della Goo F, FNP   5 months ago Obesity (BMI 30-39.9)   [facility] Berniece Salines, FNP       Future Appointments             In 11 months Zane Herald, Rudolpho Sevin, FNP [facility], [facility]

## 2022-07-19 ENCOUNTER — Telehealth: Payer: Self-pay | Admitting: Nurse Practitioner

## 2022-07-20 NOTE — Telephone Encounter (Signed)
Sent on 11/30

## 2022-07-20 NOTE — Telephone Encounter (Signed)
Unable to refill per protocol, last refill by provider 07/15/22 for 135 and 1 RF. Will refuse duplicate request.  Requested Prescriptions  Pending Prescriptions Disp Refills   levothyroxine (SYNTHROID) 25 MCG tablet [Pharmacy Med Name: LEVOTHYROXINE 0.025MG  ( ) TAB] 135 tablet 2    Sig: TAKE 1 1/2 TABLETS BY MOUTH EVERY DAY     Endocrinology:  Hypothyroid Agents Passed - 07/19/2022  3:06 PM      Passed - TSH in normal range and within 360 days    TSH  Date Value Ref Range Status  02/15/2022 2.82 mIU/L Final    Comment:              Reference Range .           > or = 20 Years  0.40-4.50 .                Pregnancy Ranges           First trimester    0.26-2.66           Second trimester   0.55-2.73           Third trimester    0.43-2.91          Passed - Valid encounter within last 12 months    Recent Outpatient Visits           1 month ago Annual physical exam   [facility] Della Goo F, FNP   2 months ago Left lower quadrant abdominal pain   [facility] Della Goo F, FNP   5 months ago Obesity (BMI 30-39.9)   [facility] Berniece Salines, FNP       Future Appointments             In 11 months Zane Herald, Rudolpho Sevin, FNP [facility], [facility]

## 2022-07-22 ENCOUNTER — Other Ambulatory Visit: Payer: Self-pay | Admitting: Emergency Medicine

## 2022-07-22 MED ORDER — LEVOTHYROXINE SODIUM 25 MCG PO TABS: 37.5000 ug | ORAL_TABLET | Freq: Every day | ORAL | 2 refills | Status: DC

## 2022-07-22 NOTE — Telephone Encounter (Signed)
Pt has 5 days worth of levothyroxine left. According to the message it says that it was sent in on 11.30.2023 however it is not showing that it went through.

## 2022-07-22 NOTE — Telephone Encounter (Signed)
Raynelle Fanning to send script again

## 2022-07-27 ENCOUNTER — Other Ambulatory Visit: Payer: Self-pay | Admitting: Nurse Practitioner

## 2022-07-27 DIAGNOSIS — E039 Hypothyroidism, unspecified: Secondary | ICD-10-CM

## 2022-07-27 MED ORDER — LEVOTHYROXINE SODIUM 25 MCG PO TABS: 37.5000 ug | ORAL_TABLET | Freq: Every day | ORAL | 2 refills | Status: DC

## 2022-07-27 NOTE — Telephone Encounter (Signed)
FYI

## 2022-07-27 NOTE — Telephone Encounter (Signed)
Pharmacy did not receive Levothyroxine refill / class says Print on the RX / please resend to [employer]

## 2022-08-25 NOTE — Progress Notes (Unsigned)
There were no vitals taken for this visit.   Subjective:    Patient ID: Tina Hill, female    DOB: September 04, 1975, 47 y.o.   MRN: [id number]  HPI: Tina Hill is a 47 y.o. female  No chief complaint on file.   Relevant past medical, surgical, family and social history reviewed and updated as indicated. Interim medical history since our last visit reviewed. Allergies and medications reviewed and updated.  Review of Systems  Constitutional: Negative for fever or weight change.  Respiratory: Negative for cough and shortness of breath.   Cardiovascular: Negative for chest pain or palpitations.  Gastrointestinal: Negative for abdominal pain, no bowel changes.  Musculoskeletal: Negative for gait problem or joint swelling.  Skin: Negative for rash.  Neurological: Negative for dizziness or headache.  No other specific complaints in a complete review of systems (except as listed in HPI above).      Objective:    There were no vitals taken for this visit.  Wt Readings from Last 3 Encounters:  06/18/22 218 lb 8 oz (99.1 kg)  04/28/22 214 lb 8 oz (97.3 kg)  02/15/22 216 lb 6.4 oz (98.2 kg)    Physical Exam  Constitutional: Patient appears well-developed and well-nourished. Obese *** No distress.  HEENT: head atraumatic, normocephalic, pupils equal and reactive to light, ears ***, neck supple, throat within normal limits Cardiovascular: Normal rate, regular rhythm and normal heart sounds.  No murmur heard. No BLE edema. Pulmonary/Chest: Effort normal and breath sounds normal. No respiratory distress. Abdominal: Soft.  There is no tenderness. Psychiatric: Patient has a normal mood and affect. behavior is normal. Judgment and thought content normal.  Results for orders placed or performed during the hospital encounter of 04/28/22  Comprehensive metabolic panel  Result Value Ref Range   Sodium 137 135 - 145 mmol/L   Potassium 4.1 3.5 - 5.1 mmol/L   Chloride 105 98 - 111  mmol/L   CO2 23 22 - 32 mmol/L   Glucose, Bld 89 70 - 99 mg/dL   BUN 11 6 - 20 mg/dL   Creatinine, Ser 0.67 0.44 - 1.00 mg/dL   Calcium 9.1 8.9 - 10.3 mg/dL   Total Protein 7.9 6.5 - 8.1 g/dL   Albumin 3.8 3.5 - 5.0 g/dL   AST 18 15 - 41 U/L   ALT 15 0 - 44 U/L   Alkaline Phosphatase 73 38 - 126 U/L   Total Bilirubin 1.1 0.3 - 1.2 mg/dL   GFR, Estimated >60 >60 mL/min   Anion gap 9 5 - 15  CBC with Differential/Platelet  Result Value Ref Range   WBC 9.6 4.0 - 10.5 K/uL   RBC 4.40 3.87 - 5.11 MIL/uL   Hemoglobin 12.9 12.0 - 15.0 g/dL   HCT 38.8 36.0 - 46.0 %   MCV 88.2 80.0 - 100.0 fL   MCH 29.3 26.0 - 34.0 pg   MCHC 33.2 30.0 - 36.0 g/dL   RDW 12.2 11.5 - 15.5 %   Platelets 300 150 - 400 K/uL   nRBC 0.0 0.0 - 0.2 %   Neutrophils Relative % 60 %   Neutro Abs 5.8 1.7 - 7.7 K/uL   Lymphocytes Relative 26 %   Lymphs Abs 2.5 0.7 - 4.0 K/uL   Monocytes Relative 8 %   Monocytes Absolute 0.8 0.1 - 1.0 K/uL   Eosinophils Relative 5 %   Eosinophils Absolute 0.5 0.0 - 0.5 K/uL   Basophils Relative 1 %   Basophils  Absolute 0.1 0.0 - 0.1 K/uL   Immature Granulocytes 0 %   Abs Immature Granulocytes 0.02 0.00 - 0.07 K/uL      Assessment & Plan:   Problem List Items Addressed This Visit   None    Follow up plan: No follow-ups on file.

## 2022-08-26 ENCOUNTER — Ambulatory Visit: Payer: 59 | Admitting: Nurse Practitioner

## 2022-08-26 ENCOUNTER — Encounter: Payer: Self-pay | Admitting: Nurse Practitioner

## 2022-08-26 ENCOUNTER — Other Ambulatory Visit: Payer: Self-pay

## 2022-08-26 VITALS — BP 126/72 | HR 95 | Temp 98.1°F | Resp 18 | Ht 64.0 in | Wt 217.7 lb

## 2022-08-26 DIAGNOSIS — M549 Dorsalgia, unspecified: Secondary | ICD-10-CM | POA: Diagnosis not present

## 2022-08-26 MED ORDER — METHOCARBAMOL 500 MG PO TABS: 500.0000 mg | ORAL_TABLET | Freq: Two times a day (BID) | ORAL | 0 refills | Status: DC | PRN

## 2022-08-26 MED ORDER — NAPROXEN 500 MG PO TABS: 500.0000 mg | ORAL_TABLET | Freq: Two times a day (BID) | ORAL | 0 refills | Status: DC

## 2022-10-14 ENCOUNTER — Other Ambulatory Visit: Payer: Self-pay

## 2022-10-14 DIAGNOSIS — I10 Essential (primary) hypertension: Secondary | ICD-10-CM | POA: Diagnosis not present

## 2022-10-14 DIAGNOSIS — E039 Hypothyroidism, unspecified: Secondary | ICD-10-CM | POA: Insufficient documentation

## 2022-10-14 DIAGNOSIS — R0789 Other chest pain: Secondary | ICD-10-CM | POA: Insufficient documentation

## 2022-10-14 DIAGNOSIS — R000 Tachycardia, unspecified: Secondary | ICD-10-CM | POA: Diagnosis not present

## 2022-10-14 DIAGNOSIS — R0602 Shortness of breath: Secondary | ICD-10-CM | POA: Insufficient documentation

## 2022-10-14 DIAGNOSIS — R791 Abnormal coagulation profile: Secondary | ICD-10-CM | POA: Diagnosis not present

## 2022-10-14 DIAGNOSIS — R079 Chest pain, unspecified: Secondary | ICD-10-CM | POA: Diagnosis not present

## 2022-10-14 DIAGNOSIS — R0601 Orthopnea: Secondary | ICD-10-CM | POA: Insufficient documentation

## 2022-10-14 DIAGNOSIS — R7989 Other specified abnormal findings of blood chemistry: Secondary | ICD-10-CM | POA: Diagnosis not present

## 2022-10-14 NOTE — ED Triage Notes (Signed)
Pt reports tonight having upper back pain; st when lying down she has chest "tightness" and SHOB; denies hx of same; denies any recent illness

## 2022-10-14 NOTE — ED Triage Notes (Signed)
Reports central chest pain with onset 2000 today. Reports worsens when she lays flat, which causes a pressure sensation and SOB. Pt reports pain/pressure and SOB improve when she is sitting up. Pt alert and oriented following commands. Breathing unlabored speaking in full sentences.

## 2022-10-15 ENCOUNTER — Emergency Department: Payer: 59

## 2022-10-15 ENCOUNTER — Other Ambulatory Visit: Payer: 59

## 2022-10-15 ENCOUNTER — Emergency Department
Admission: EM | Admit: 2022-10-15 | Discharge: 2022-10-15 | Disposition: A | Discharge status: Home / Self Care | Payer: 59 | Attending: Emergency Medicine | Admitting: Emergency Medicine

## 2022-10-15 DIAGNOSIS — R000 Tachycardia, unspecified: Secondary | ICD-10-CM | POA: Diagnosis not present

## 2022-10-15 DIAGNOSIS — R079 Chest pain, unspecified: Secondary | ICD-10-CM | POA: Diagnosis not present

## 2022-10-15 DIAGNOSIS — R0602 Shortness of breath: Secondary | ICD-10-CM

## 2022-10-15 DIAGNOSIS — R7989 Other specified abnormal findings of blood chemistry: Secondary | ICD-10-CM | POA: Diagnosis not present

## 2022-10-15 DIAGNOSIS — R0601 Orthopnea: Secondary | ICD-10-CM

## 2022-10-15 DIAGNOSIS — R0789 Other chest pain: Secondary | ICD-10-CM

## 2022-10-15 LAB — CBC
HCT: 39.4 % (ref 36.0–46.0)
Hemoglobin: 13.1 g/dL (ref 12.0–15.0)
MCH: 30 pg (ref 26.0–34.0)
MCHC: 33.2 g/dL (ref 30.0–36.0)
MCV: 90.4 fL (ref 80.0–100.0)
Platelets: 349 10*3/uL (ref 150–400)
RBC: 4.36 MIL/uL (ref 3.87–5.11)
RDW: 12.2 % (ref 11.5–15.5)
WBC: 11.9 10*3/uL — ABNORMAL HIGH (ref 4.0–10.5)
nRBC: 0 % (ref 0.0–0.2)

## 2022-10-15 LAB — BASIC METABOLIC PANEL
Anion gap: 10 (ref 5–15)
BUN: 16 mg/dL (ref 6–20)
CO2: 24 mmol/L (ref 22–32)
Calcium: 9.3 mg/dL (ref 8.9–10.3)
Chloride: 103 mmol/L (ref 98–111)
Creatinine, Ser: 0.75 mg/dL (ref 0.44–1.00)
GFR, Estimated: >60 mL/min (ref >60)
Glucose, Bld: 99 mg/dL (ref 70–99)
Potassium: 3.7 mmol/L (ref 3.5–5.1)
Sodium: 137 mmol/L (ref 135–145)

## 2022-10-15 LAB — TROPONIN I (HIGH SENSITIVITY): Troponin I (High Sensitivity): 2 ng/L (ref <18)

## 2022-10-15 LAB — D-DIMER, QUANTITATIVE: D-Dimer, Quant: 0.52 ug/mL-FEU — ABNORMAL HIGH (ref 0.00–0.50)

## 2022-10-15 LAB — BRAIN NATRIURETIC PEPTIDE: B Natriuretic Peptide: 14.5 pg/mL (ref 0.0–100.0)

## 2022-10-15 MED ORDER — FUROSEMIDE 20 MG PO TABS: 20.0000 mg | ORAL_TABLET | Freq: Every day | ORAL | 1 refills | Status: DC

## 2022-10-15 MED ORDER — IOHEXOL 350 MG/ML SOLN
75.0000 mL | Freq: Once | INTRAVENOUS | Status: AC | PRN
  Administered 2022-10-15: 75 mL via INTRAVENOUS

## 2022-10-15 NOTE — Discharge Instructions (Addendum)
Please start taking the Lasix once daily, preferably in the morning, to help offload extra fluid that could be contributing to your symptoms (heart failure).  You will get a phone call from the heart failure clinic and a cardiologist to schedule an appointment in the clinic.  If you develop any worsening symptoms in the meantime, please return to the ED

## 2022-10-15 NOTE — ED Notes (Signed)
While waiting in lobby patient began screaming for help for her husband, reporting that he was unresponsive. RN went to assess situation and found pt spouse awake and covered in vomit. Pt and spouse reported to RN that spouse had briefly gone pale and unresponsive and then woke vomiting. Pt spouse noted to be pale and mildly diaphoretic but awake, oriented and following commands. Spouse denies chest pain or pressure and stated he felt he was just dehydrated. RN assisted pt to triage where RN helped pt clean self in bathroom. Pt ambulatory in triage and normal color regained. Diaphoresis ceased as well. Pt spouse continued to be alert and oriented and denies chest pain or pressure. He also denies h/a, dizziness, vision change. Pt spouse refused to be checked in and evaluated despite education by RN and encouragement from wife. RN informed pt and her spouse that he is welcome to check in at any time should he change his mind and wish to be evaluated. Charge RN made aware.

## 2022-10-15 NOTE — ED Provider Notes (Signed)
[facility] Provider Note    Event Date/Time   First MD Initiated Contact with Patient 10/15/22 0245     (approximate)   History   Chest Pain   HPI  Tina Hill is a 47 y.o. female who presents to the ED for evaluation of Chest Pain   Patient with a history of hypothyroidism and obesity presents to the ED for evaluation of chest discomfort and orthopnea.  She reports symptoms are both worse when laying down and improving with sitting upright.  Denies any exertional symptoms.  Uncertain about weight gain as she does not measure her weight regularly.  Does not check her blood pressure at home and did not think she had a history of hypertension, though noted to be fairly hypertensive in triage.   Physical Exam   Triage Vital Signs: ED Triage Vitals  Enc Vitals Group     BP 10/14/22 2350 (!) 170/98     Pulse Rate 10/14/22 2350 (!) 101     Resp 10/14/22 2350 19     Temp 10/14/22 2350 98 F (36.7 C)     Temp Source 10/14/22 2350 Oral     SpO2 10/14/22 2350 97 %     Weight 10/14/22 2353 180 lb (81.6 kg)     Height 10/14/22 2353 '5\' 4"'$  (1.626 m)     Head Circumference --      Peak Flow --      Pain Score 10/14/22 2353 5     Pain Loc --      Pain Edu? --      Excl. in [place]? --     Most recent vital signs: Vitals:   10/14/22 2350 10/15/22 0244  BP: (!) 170/98 (!) 151/85  Pulse: (!) 101 85  Resp: 19 17  Temp: 98 F (36.7 C) 98.1 F (36.7 C)  SpO2: 97% 100%    General: Awake, no distress.  CV:  Good peripheral perfusion.  RRR without appreciable murmur Resp:  Normal effort.  Abd:  No distention.  MSK:  No deformity noted.  Chest pain is not reproducible on palpation.  No overlying skin changes. Neuro:  No focal deficits appreciated. Other:     ED Results / Procedures / Treatments   Labs (all labs ordered are listed, but only abnormal results are displayed) Labs Reviewed  CBC - Abnormal; Notable for the following components:       Result Value   WBC 11.9 (*)    All other components within normal limits  D-DIMER, QUANTITATIVE - Abnormal; Notable for the following components:   D-Dimer, Quant 0.52 (*)    All other components within normal limits  BASIC METABOLIC PANEL  BRAIN NATRIURETIC PEPTIDE  TROPONIN I (HIGH SENSITIVITY)    EKG Sinus rhythm with a rate of 99 bpm.  Normal axis and intervals.  No clear signs of acute ischemia.  RADIOLOGY 2 view CXR interpreted by me with pulmonary vascular congestion but no pleural effusion or discrete infiltrate. CTA chest interpreted by me without evidence of PE  Official radiology report(s): CT Angio Chest PE W and/or Wo Contrast  Result Date: 10/15/2022 CLINICAL DATA:  History of CHF. Tachycardia and chest pain with positive D-dimer2 EXAM: CT ANGIOGRAPHY CHEST WITH CONTRAST TECHNIQUE: Multidetector CT imaging of the chest was performed using the standard protocol during bolus administration of intravenous contrast. Multiplanar CT image reconstructions and MIPs were obtained to evaluate the vascular anatomy. RADIATION DOSE REDUCTION: This exam was performed according to  the departmental dose-optimization program which includes automated exposure control, adjustment of the mA and/or kV according to patient size and/or use of iterative reconstruction technique. CONTRAST:  66m OMNIPAQUE IOHEXOL 350 MG/ML SOLN COMPARISON:  None Available. FINDINGS: Cardiovascular: Suboptimal but satisfactory opacification of the pulmonary arteries to the segmental level. No evidence of pulmonary embolism. Normal heart size. No pericardial effusion. Mediastinum/Nodes: Negative for mass or adenopathy Lungs/Pleura: Mosaic attenuation of the upper lungs, usually small airways disease. There is chart history of asthma. There is no edema, consolidation, effusion, or pneumothorax. Upper Abdomen: No acute finding Musculoskeletal: No acute finding Review of the MIP images confirms the above findings. IMPRESSION: 1.  Negative for pulmonary embolism. 2. Mosaic attenuation of the lungs usually from small airways disease, noted chart history of asthma. Electronically Signed   By: JJorje GuildM.D.   On: 10/15/2022 04:21   DG Chest 2 View  Result Date: 10/15/2022 CLINICAL DATA:  Chest pain. EXAM: CHEST - 2 VIEW COMPARISON:  None Available. FINDINGS: The heart size and mediastinal contours are within normal limits. There is mild prominence of the infrahilar pulmonary vasculature. Both lungs are clear. The visualized skeletal structures are unremarkable. IMPRESSION: Findings suggestive of mild pulmonary vascular congestion. Electronically Signed   By: TVirgina NorfolkM.D.   On: 10/15/2022 00:08    PROCEDURES and INTERVENTIONS:  .1-3 Lead EKG Interpretation  Performed by: SVladimir Crofts MD Authorized by: SVladimir Crofts MD     Interpretation: normal     ECG rate:  80   ECG rate assessment: normal     Rhythm: sinus rhythm     Ectopy: none     Conduction: normal     Medications  iohexol (OMNIPAQUE) 350 MG/ML injection 75 mL (75 mLs Intravenous Contrast Given 10/15/22 0402)     IMPRESSION / MDM / ASSESSMENT AND PLAN / ED COURSE  I reviewed the triage vital signs and the nursing notes.  Differential diagnosis includes, but is not limited to, ACS, PTX, PNA, muscle strain/spasm, PE, dissection  {Patient presents with symptoms of an acute illness or injury that is potentially life-threatening.  Obese 47year old woman presents with orthopnea and positional chest discomfort, possibly representing new onset CHF and requiring outpatient referral to cardiology in the CHF clinic.  She presents tachycardic, sitting upright preferentially but looking well.  EKG is nonischemic and 2 troponins are negative.  BNP is noted to be normal.  D-dimer slightly elevated so CTA chest obtained but no evidence of PE.  Her CXR is congested and her symptoms are certainly concerning for new onset CHF or diastolic dysfunction in the  setting of her hypertension, obesity and presenting symptoms.  She has normal metabolic panel and CBC with a marginal leukocytosis, but see no evidence of infectious etiology of her symptoms.  We will discharge with small dose of Lasix to start daily as we refer her to the CHF clinic and cardiology.  We discussed appropriate return precautions.  Clinical Course as of 10/15/22 0502  Fri Oct 15, 2022  0455 Reassessed and discussed CT results.  Again listened and heard no wheezing.  We discussed the possibility of CHF versus other possible etiologies of her symptoms.  We discussed adding a very low-dose of Lasix and following up with CHF clinic.  We discussed return precautions. [DS]    Clinical Course User Index [DS] SVladimir Crofts MD     FINAL CLINICAL IMPRESSION(S) / ED DIAGNOSES   Final diagnoses:  Other chest pain  Orthopnea  Shortness of  breath     Rx / DC Orders   ED Discharge Orders          Ordered    AMB referral to CHF clinic        10/15/22 0456    Ambulatory referral to Cardiology        10/15/22 0456    furosemide (LASIX) 20 MG tablet  Daily        10/15/22 0458             Note:  This document was prepared using Dragon voice recognition software and may include unintentional dictation errors.   Vladimir Crofts, MD 10/15/22 [phone]

## 2022-10-18 ENCOUNTER — Encounter: Payer: Self-pay | Admitting: Emergency Medicine

## 2022-10-18 ENCOUNTER — Ambulatory Visit: Payer: Self-pay

## 2022-10-18 ENCOUNTER — Emergency Department: Payer: 59

## 2022-10-18 ENCOUNTER — Emergency Department
Admission: EM | Admit: 2022-10-18 | Discharge: 2022-10-18 | Disposition: A | Discharge status: Home / Self Care | Payer: 59 | Attending: Emergency Medicine | Admitting: Emergency Medicine

## 2022-10-18 ENCOUNTER — Ambulatory Visit: Payer: 59 | Admitting: Physician Assistant

## 2022-10-18 ENCOUNTER — Ambulatory Visit: Payer: Self-pay | Admitting: *Deleted

## 2022-10-18 ENCOUNTER — Encounter: Payer: Self-pay | Admitting: Physician Assistant

## 2022-10-18 ENCOUNTER — Other Ambulatory Visit: Payer: Self-pay

## 2022-10-18 VITALS — BP 128/82 | HR 105 | Temp 98.1°F | Resp 16 | Ht 64.0 in | Wt 213.8 lb

## 2022-10-18 DIAGNOSIS — R0789 Other chest pain: Secondary | ICD-10-CM | POA: Diagnosis not present

## 2022-10-18 DIAGNOSIS — J4520 Mild intermittent asthma, uncomplicated: Secondary | ICD-10-CM

## 2022-10-18 DIAGNOSIS — R002 Palpitations: Secondary | ICD-10-CM | POA: Diagnosis not present

## 2022-10-18 DIAGNOSIS — R079 Chest pain, unspecified: Secondary | ICD-10-CM

## 2022-10-18 DIAGNOSIS — R071 Chest pain on breathing: Secondary | ICD-10-CM

## 2022-10-18 LAB — CBC
HCT: 41.4 % (ref 36.0–46.0)
Hemoglobin: 14.1 g/dL (ref 12.0–15.0)
MCH: 30.3 pg (ref 26.0–34.0)
MCHC: 34.1 g/dL (ref 30.0–36.0)
MCV: 88.8 fL (ref 80.0–100.0)
Platelets: 367 10*3/uL (ref 150–400)
RBC: 4.66 MIL/uL (ref 3.87–5.11)
RDW: 12.2 % (ref 11.5–15.5)
WBC: 11 10*3/uL — ABNORMAL HIGH (ref 4.0–10.5)
nRBC: 0 % (ref 0.0–0.2)

## 2022-10-18 LAB — BASIC METABOLIC PANEL
Anion gap: 9 (ref 5–15)
BUN: 14 mg/dL (ref 6–20)
CO2: 24 mmol/L (ref 22–32)
Calcium: 9.5 mg/dL (ref 8.9–10.3)
Chloride: 102 mmol/L (ref 98–111)
Creatinine, Ser: 0.73 mg/dL (ref 0.44–1.00)
GFR, Estimated: >60 mL/min (ref >60)
Glucose, Bld: 105 mg/dL — ABNORMAL HIGH (ref 70–99)
Potassium: 3.7 mmol/L (ref 3.5–5.1)
Sodium: 135 mmol/L (ref 135–145)

## 2022-10-18 LAB — BRAIN NATRIURETIC PEPTIDE: B Natriuretic Peptide: 5.3 pg/mL (ref 0.0–100.0)

## 2022-10-18 LAB — TROPONIN I (HIGH SENSITIVITY): Troponin I (High Sensitivity): 3 ng/L (ref <18)

## 2022-10-18 MED ORDER — ALBUTEROL SULFATE HFA 108 (90 BASE) MCG/ACT IN AERS: 2.0000 | INHALATION_SPRAY | Freq: Four times a day (QID) | RESPIRATORY_TRACT | 0 refills | Status: AC | PRN

## 2022-10-18 MED ORDER — MELOXICAM 15 MG PO TABS: 15.0000 mg | ORAL_TABLET | Freq: Every day | ORAL | 0 refills | Status: AC

## 2022-10-18 NOTE — Telephone Encounter (Signed)
  Chief Complaint: Left sided chest pain Symptoms: Pain "More to the side, near shoulder and arm pit." Worsening since AM,"  Now constant 7/10. "Shaky" at times Frequency: This AM. Seen in ED 10/15/22 and OV today Pertinent Negatives: Patient denies dysuria,nausea, sweating Disposition: '[x]'$ ED /'[]'$ Urgent Care (no appt availability in office) / '[]'$ Appointment(In office/virtual)/ '[]'$  [place] [facility]/ '[]'$ Home Care/ '[]'$ Refused Recommended Disposition /'[]'$  Mobile Bus/ '[]'$  Follow-up with PCP Additional Notes: Advised ED, states will follow disposition.  Reason for Disposition  [1] SEVERE pain (e.g., excruciating, scale 8-10) AND [2] present > 1 hour  Answer Assessment - Initial Assessment Questions 1. LOCATION: "Where does it hurt?" (e.g., left, right)     Upper flank area, near shoulder and axilla. Radiates to back 2. ONSET: "When did the pain start?"     8am 3. SEVERITY: "How bad is the pain?" (e.g., Scale 1-10; mild, moderate, or severe)   - MILD (1-3): doesn't interfere with normal activities    - MODERATE (4-7): interferes with normal activities or awakens from sleep    - SEVERE (8-10): excruciating pain and patient unable to do normal activities (stays in bed)       7/10 4. PATTERN: "Does the pain come and go, or is it constant?"      Comes and goes, increased in intensity 5. CAUSE: "What do you think is causing the pain?"     Unsure 6. OTHER SYMPTOMS:  "Do you have any other symptoms?" (e.g., fever, abdomen pain, vomiting, leg weakness, burning with urination, blood in urine)     "Shaky" feeling.  Protocols used: Flank Pain-A-AH

## 2022-10-18 NOTE — Telephone Encounter (Signed)
  Chief Complaint: Needs clarification on after visit summary from ED visit. Symptoms:  Frequency:  Pertinent Negatives: Patient denies  Disposition: '[]'$ ED /'[]'$ Urgent Care (no appt availability in office) / '[]'$ Appointment(In office/virtual)/ '[]'$  [facility] [facility]/ '[]'$ Home Care/ '[]'$ Refused Recommended Disposition /'[]'$ [place] [facility]/ '[x]'$  Follow-up with PCP Additional Notes: Pt was seen at ED. She had several test performed. Pt would like call back from provider to discuss findings and POC.    Summary: understaning medical terms from ER visit   Pt states that she was in the ER on Thursday night and does not understand the medical terms and would like a nurse to call her back to go over with her.  Please advise     Reason for Disposition  [1] Follow-up call from patient regarding patient's clinical status AND [2] information urgent  Answer Assessment - Initial Assessment Questions 1. REASON FOR CALL or QUESTION: "What is your reason for calling today?" or "How can I best help you?" or "What question do you have that I can help answer?"     Pt is requesting better explanation from ED visit. 2. CALLER: Document the source of call. (e.g., laboratory, patient).     ED visit  Protocols used: PCP Call - No Triage-A-AH

## 2022-10-18 NOTE — Progress Notes (Signed)
Office Visit   Patient: Tina Hill   DOB: [birth year]   47 y.o. Female  MRN: [id number] Visit Date: 10/18/2022  Today's healthcare provider: Dani Gobble Tameka Hoiland, PA-C  Introduced myself to the patient as a Journalist, newspaper and provided education on APPs in clinical practice.    Chief Complaint  Patient presents with  . Hospitalization Follow-up    Still having episodes while laying   Subjective    HPI HPI     Hospitalization Follow-up    Additional comments: Still having episodes while laying      Last edited by Salomon Fick, [place] on 10/18/2022 10:38 AM.      Patient was seen in the ED on 10/15/22 for orthopnea and chest pain Reviewed ED visit note, imaging, labs results  She was given lasix and referral to CHF clinic for evaluation and follow up   Today's visit  She reports she has started to have left sided chest pain that radiates into back - this started today  She reports she is still having a feeling of heaviness and pressure in her chest and back, especially when she lays down She reports the ED told her she had excess fluid in her lungs but they are not sure where it is coming from She reports she has been checking her BP and it has been in the 130s/ 85   She reports some exertional SOB that resolves with rest  She states last night she tried laying down flat and had recurrent chest pressure but this went away when she laid on her side   She states she thinks she might have palpitations but is not sure if this is due to anxiety over the weekend or something else  She reports she has not had issues with her asthma since 2009 and has not used an inhaler for several years at this point  She has an apt tomorrow with Cardiology for [place] and then has apt in April with Cardiology   Medications: Outpatient Medications Prior to Visit  Medication Sig  . furosemide (LASIX) 20 MG tablet Take 1 tablet (20 mg total) by mouth daily.  Marland Kitchen levothyroxine (SYNTHROID) 25 MCG  tablet Take 1.5 tablets (37.5 mcg total) by mouth daily before breakfast.  . methocarbamol (ROBAXIN) 500 MG tablet Take 1 tablet (500 mg total) by mouth 2 (two) times daily as needed for muscle spasms. (Patient not taking: Reported on 10/18/2022)  . naproxen (NAPROSYN) 500 MG tablet Take 1 tablet (500 mg total) by mouth 2 (two) times daily with a meal. (Patient not taking: Reported on 10/18/2022)   No facility-administered medications prior to visit.    Review of Systems  Eyes:  Negative for visual disturbance.  Respiratory:  Positive for cough and shortness of breath. Negative for chest tightness and wheezing.   Cardiovascular:  Positive for chest pain, palpitations and leg swelling (Unsure if she has had this).  Neurological:  Negative for dizziness, light-headedness and headaches.    {Labs  Heme  Chem  Endocrine  Serology  Results Review (optional):23779}   Objective    BP 128/82   Pulse (!) 105   Temp 98.1 F (36.7 C) (Oral)   Resp 16   Ht '5\' 4"'$  (1.626 m)   Wt 213 lb 12.8 oz (97 kg)   LMP 10/07/2022 (Exact Date)   SpO2 99%   BMI 36.70 kg/m  {Show previous vital signs (optional):23777}  Physical Exam Vitals reviewed.  Constitutional:      General: She is awake.     Appearance: Normal appearance. She is well-developed and well-groomed.  HENT:     Head: Normocephalic and atraumatic.  Eyes:     General: Lids are normal. Gaze aligned appropriately.     Extraocular Movements: Extraocular movements intact.     Conjunctiva/sclera: Conjunctivae normal.  Cardiovascular:     Rate and Rhythm: Normal rate and regular rhythm.     Pulses: Normal pulses.          Radial pulses are 2+ on the right side and 2+ on the left side.     Heart sounds: Normal heart sounds. No murmur heard.    No friction rub. No gallop.  Pulmonary:     Effort: Pulmonary effort is normal.     Breath sounds: Normal breath sounds. No decreased air movement. No decreased breath sounds, wheezing, rhonchi or  rales.  Musculoskeletal:     Cervical back: Normal range of motion.     Right lower leg: No edema.     Left lower leg: No edema.  Neurological:     Mental Status: She is alert.  Psychiatric:        Behavior: Behavior is cooperative.      No results found for any visits on 10/18/22.  Assessment & Plan      No follow-ups on file.       Problem List Items Addressed This Visit   None Visit Diagnoses     Chest pain on breathing    -  Primary   Relevant Orders   CBC w/Diff/Platelet   COMPLETE METABOLIC PANEL WITH GFR   TSH   B Nat Peptide   Heart palpitations       Relevant Orders   COMPLETE METABOLIC PANEL WITH GFR   TSH   Mild intermittent asthma without complication       Relevant Medications   albuterol (VENTOLIN HFA) 108 (90 Base) MCG/ACT inhaler        No follow-ups on file.   I, Carrieann Spielberg E Vale Peraza, PA-C, have reviewed all documentation for this visit. The documentation on 10/18/22 for the exam, diagnosis, procedures, and orders are all accurate and complete.   Talitha Givens, MHS, PA-C [facility]

## 2022-10-18 NOTE — Progress Notes (Unsigned)
   Patient ID: Tina Hill, female    DOB: [birth year], 47 y.o.   MRN: [id number]  HPI  Ms Tina Hill is a 47 y/o female with a history of  No echo has been done.   Was in the ED 10/15/22 due to chest pain and orthopnea due to new onset HF. Low dose furosemide started.   She presents today for her initial HF Clinic visit with a chief complaint of   Review of Systems    Physical Exam    Assessment & Plan:  1: SOB/ ? Heart failure- - NYHA class - have scheduled echo for - BNP 10/15/22 was 14.5  2: HTN- - BP - saw PCP Tina Hill) 10/18/22 - BMP 10/15/22 showed sodium 137, potassium 3.7, creatinine 0.75 & GFR >60

## 2022-10-18 NOTE — ED Provider Notes (Signed)
[facility] Provider Note  Patient Contact: 6:10 PM (approximate)   History   Chest Pain   HPI  Tina Hill is a 47 y.o. female who presents the emergency department complaining of left-sided chest pain.  Patient was seen once in this department 3 days ago, has been seen by her primary care and is supposed to see cardiology tomorrow for an echo.  Patient was seen for some exercise and exertional shortness of breath.  She also had some mild orthopnea and presented to the emergency department 3 days ago.  Her workup at the time was reassuring with no elevation of troponins, reassuring EKG, reassuring BNP.  Patient had some possible mild vascular congestion on x-ray.  CT scan was obtained which did not reveal any significant vascular congestion, PE or other findings in the lungs such as pneumonia, pleural edema.  Patient was referred to cardiology for further evaluation.  Saw her primary care today, mention that she had some left but appears to be chest wall pain.  Was referred to the emergency department for further evaluation.  Patient has no cardiac history.  She states that she has never been diagnosed with CHF other than 3 days ago where there was concern over the vascular congestion.  Patient has had significant lifestyle changes in regards to food to limit/eliminate salt, meat, sugar.  At this time patient states that the pain appears to be positional and reproducible with palpation.  No direct trauma to the chest they noted that she has been doing increased yard work over the past week.  She does have a history of asthma though she has had no wheezing.     Physical Exam   Triage Vital Signs: ED Triage Vitals  Enc Vitals Group     BP 10/18/22 1542 (!) 141/93     Pulse Rate 10/18/22 1542 (!) 110     Resp 10/18/22 1542 18     Temp 10/18/22 1542 98.3 F (36.8 C)     Temp Source 10/18/22 1542 Oral     SpO2 10/18/22 1542 98 %     Weight 10/18/22 1618 213 lb  10 oz (96.9 kg)     Height 10/18/22 1618 '5\' 4"'$  (1.626 m)     Head Circumference --      Peak Flow --      Pain Score 10/18/22 1541 5     Pain Loc --      Pain Edu? --      Excl. in [place]? --     Most recent vital signs: Vitals:   10/18/22 1542  BP: (!) 141/93  Pulse: (!) 110  Resp: 18  Temp: 98.3 F (36.8 C)  SpO2: 98%     General: Alert and in no acute distress.   Cardiovascular:  Good peripheral perfusion.  No appreciable murmurs, rubs, gallops.  No peripheral edema Respiratory: Normal respiratory effort without tachypnea or retractions. Lungs CTAB. Good air entry to the bases with no decreased or absent breath sounds Gastrointestinal: Bowel sounds 4 quadrants. Soft and nontender to palpation. No guarding or rigidity. No palpable masses. No distention. No CVA tenderness. Musculoskeletal: Full range of motion to all extremities.  Visualization of chest wall reveals no visible signs of trauma.  Palpation reveals some mild tenderness along the left anterior chest wall.  This palpation does reproduce symptoms.  No palpable abnormality or crepitus.  Good Breath sounds bilaterally. Neurologic:  No gross focal neurologic deficits are appreciated.  Skin:  No rash noted Other:   ED Results / Procedures / Treatments   Labs (all labs ordered are listed, but only abnormal results are displayed) Labs Reviewed  BASIC METABOLIC PANEL - Abnormal; Notable for the following components:      Result Value   Glucose, Bld 105 (*)    All other components within normal limits  CBC - Abnormal; Notable for the following components:   WBC 11.0 (*)    All other components within normal limits  BRAIN NATRIURETIC PEPTIDE  POC URINE PREG, ED  TROPONIN I (HIGH SENSITIVITY)     EKG     RADIOLOGY  I personally viewed, evaluated, and interpreted these images as part of my medical decision making, as well as reviewing the written report by the radiologist.  ED Provider Interpretation: No  acute cardiopulmonary findings on chest x-ray.  No evidence of pleural effusion, pulmonary edema or vascular congestion  DG Chest 2 View  Result Date: 10/18/2022 CLINICAL DATA:  Chest pain EXAM: CHEST - 2 VIEW COMPARISON:  Chest radiograph dated 10/15/2022 FINDINGS: Normal lung volumes. No focal consolidations. No pleural effusion or pneumothorax. The heart size and mediastinal contours are within normal limits. The visualized skeletal structures are unremarkable. IMPRESSION: No active cardiopulmonary disease. Electronically Signed   By: Darrin Nipper M.D.   On: 10/18/2022 16:03    PROCEDURES:  Critical Care performed: No  Procedures   MEDICATIONS ORDERED IN ED: Medications - No data to display   IMPRESSION / MDM / [place] / ED COURSE  I reviewed the triage vital signs and the nursing notes.                                 Differential diagnosis includes, but is not limited to, ACS/STEMI, PE, pneumonia, bronchitis, CHF, asthma exacerbation, costochondral pain/costochondritis  Patient's presentation is most consistent with acute presentation with potential threat to life or bodily function.   Patient's diagnosis is consistent with nonspecific chest pain, chest wall pain.  Patient presents the emergency department with left-sided chest/chest wall pain.  This pain appears to be positional and reproducible with palpation.  Patient has had some increased activity over the last week to 10 days.  She has also noted some exertional shortness of breath.  Patient was seen 3 days ago, there was some concern that the x-ray had some vascular congestion concerning for possible early heart failure.  Patient has no history of same.  No peripheral edema.  BNP at the time of initial visit and today are both reassuring.  Troponins are reassuring.  EKGs have been reassuring.  Patient had a CT scan for PE 3 days ago with no evidence of embolus and no evidence of edema or vascular congestion on CT.   Patient is currently scheduled to see cardiology tomorrow for echo.  Given today's reassuring workup, reproducible symptoms I do not feel that this is likely cardiac.  I do recommend following up with cardiology tomorrow to complete her cardiac workup but I will place the patient on an anti-inflammatory for what I suspect is costochondral pain.  Patient has labs from primary care to evaluate for her thyroid as she does have a history of hypothyroidism and is on medication for same.  No indication for further workup this evening.  She will see cardiology tomorrow.  Concerning signs and symptoms to actually return to the emergency department are discussed with the patient and her  husband..  Patient is given ED precautions to return to the ED for any worsening or new symptoms.     FINAL CLINICAL IMPRESSION(S) / ED DIAGNOSES   Final diagnoses:  Nonspecific chest pain  Costochondral chest pain     Rx / DC Orders   ED Discharge Orders          Ordered    meloxicam (MOBIC) 15 MG tablet  Daily        10/18/22 1825             Note:  This document was prepared using Dragon voice recognition software and may include unintentional dictation errors.   Brynda Peon 10/18/22 1825    Lucillie Garfinkel, MD 10/18/22 [phone]

## 2022-10-18 NOTE — ED Triage Notes (Signed)
Patient to ED via [facility] for left sided CP that started this AM. Patient states pain radiates into left shoulder and into back. Seen on Thursday and was told she had possible HF. Currently on lasix and suppose to an ultrasound tomorrow for same.

## 2022-10-18 NOTE — Telephone Encounter (Signed)
FYI

## 2022-10-18 NOTE — Telephone Encounter (Signed)
Pt has an appt today.  

## 2022-10-19 ENCOUNTER — Encounter: Payer: Self-pay | Admitting: Family

## 2022-10-19 ENCOUNTER — Ambulatory Visit: Payer: 59 | Attending: Family | Admitting: Family

## 2022-10-19 ENCOUNTER — Encounter: Payer: Self-pay | Admitting: Nurse Practitioner

## 2022-10-19 VITALS — BP 145/85 | HR 91 | Resp 14 | Ht 64.0 in | Wt 215.0 lb

## 2022-10-19 DIAGNOSIS — R002 Palpitations: Secondary | ICD-10-CM | POA: Insufficient documentation

## 2022-10-19 DIAGNOSIS — E079 Disorder of thyroid, unspecified: Secondary | ICD-10-CM | POA: Diagnosis not present

## 2022-10-19 DIAGNOSIS — I110 Hypertensive heart disease with heart failure: Secondary | ICD-10-CM | POA: Insufficient documentation

## 2022-10-19 DIAGNOSIS — I10 Essential (primary) hypertension: Secondary | ICD-10-CM | POA: Diagnosis not present

## 2022-10-19 DIAGNOSIS — R0602 Shortness of breath: Secondary | ICD-10-CM | POA: Diagnosis not present

## 2022-10-19 DIAGNOSIS — E039 Hypothyroidism, unspecified: Secondary | ICD-10-CM

## 2022-10-19 DIAGNOSIS — R079 Chest pain, unspecified: Secondary | ICD-10-CM | POA: Diagnosis not present

## 2022-10-19 DIAGNOSIS — R000 Tachycardia, unspecified: Secondary | ICD-10-CM | POA: Insufficient documentation

## 2022-10-19 DIAGNOSIS — J45909 Unspecified asthma, uncomplicated: Secondary | ICD-10-CM | POA: Diagnosis not present

## 2022-10-19 DIAGNOSIS — I509 Heart failure, unspecified: Secondary | ICD-10-CM | POA: Insufficient documentation

## 2022-10-19 DIAGNOSIS — Z79899 Other long term (current) drug therapy: Secondary | ICD-10-CM | POA: Diagnosis not present

## 2022-10-19 LAB — CBC WITH DIFFERENTIAL/PLATELET
Absolute Monocytes: 628 cells/uL (ref 200–950)
Basophils Absolute: 72 cells/uL (ref 0–200)
Basophils Relative: 0.7 %
Eosinophils Absolute: 155 cells/uL (ref 15–500)
Eosinophils Relative: 1.5 %
HCT: 43.1 % (ref 35.0–45.0)
Hemoglobin: 14.5 g/dL (ref 11.7–15.5)
Lymphs Abs: 2142 cells/uL (ref 850–3900)
MCH: 30.3 pg (ref 27.0–33.0)
MCHC: 33.6 g/dL (ref 32.0–36.0)
MCV: 90 fL (ref 80.0–100.0)
MPV: 10.9 fL (ref 7.5–12.5)
Monocytes Relative: 6.1 %
Neutro Abs: 7303 cells/uL (ref 1500–7800)
Neutrophils Relative %: 70.9 %
Platelets: 378 10*3/uL (ref 140–400)
RBC: 4.79 10*6/uL (ref 3.80–5.10)
RDW: 11.8 % (ref 11.0–15.0)
Total Lymphocyte: 20.8 %
WBC: 10.3 10*3/uL (ref 3.8–10.8)

## 2022-10-19 LAB — COMPLETE METABOLIC PANEL WITH GFR
AG Ratio: 1.2 (calc) (ref 1.0–2.5)
ALT: 16 U/L (ref 6–29)
AST: 19 U/L (ref 10–35)
Albumin: 4.5 g/dL (ref 3.6–5.1)
Alkaline phosphatase (APISO): 81 U/L (ref 31–125)
BUN: 12 mg/dL (ref 7–25)
CO2: 24 mmol/L (ref 20–32)
Calcium: 10.1 mg/dL (ref 8.6–10.2)
Chloride: 100 mmol/L (ref 98–110)
Creat: 0.8 mg/dL (ref 0.50–0.99)
Globulin: 3.9 g/dL (calc) — ABNORMAL HIGH (ref 1.9–3.7)
Glucose, Bld: 118 mg/dL — ABNORMAL HIGH (ref 65–99)
Potassium: 3.9 mmol/L (ref 3.5–5.3)
Sodium: 138 mmol/L (ref 135–146)
Total Bilirubin: 1.1 mg/dL (ref 0.2–1.2)
Total Protein: 8.4 g/dL — ABNORMAL HIGH (ref 6.1–8.1)
eGFR: 91 mL/min/{1.73_m2} (ref >=60)

## 2022-10-19 LAB — BRAIN NATRIURETIC PEPTIDE: Brain Natriuretic Peptide: <4 pg/mL (ref <100)

## 2022-10-19 LAB — TSH: TSH: 5.02 mIU/L — ABNORMAL HIGH

## 2022-10-19 MED ORDER — METOPROLOL SUCCINATE ER 25 MG PO TB24: 12.5000 mg | ORAL_TABLET | Freq: Every day | ORAL | 5 refills | Status: DC

## 2022-10-19 NOTE — Progress Notes (Signed)
Your labs are overall pretty normal and stable with the exception of your thyroid. Your thyroid stimulating hormone is mildly elevated which can indicate that the thyroid is not working like it should. I recommend a follow up apt for further labs so we can see if this is transient or something that needs to be looked into further.

## 2022-10-19 NOTE — Progress Notes (Unsigned)
   LMP 10/07/2022 (Exact Date)    Subjective:    Patient ID: Tina Hill, female    DOB: February 09, 1976, 47 y.o.   MRN: [id number]  HPI: Tina Hill is a 47 y.o. female  No chief complaint on file.   Relevant past medical, surgical, family and social history reviewed and updated as indicated. Interim medical history since our last visit reviewed. Allergies and medications reviewed and updated.  Review of Systems  Constitutional: Negative for fever or weight change.  Respiratory: Negative for cough and shortness of breath.   Cardiovascular: Negative for chest pain or palpitations.  Gastrointestinal: Negative for abdominal pain, no bowel changes.  Musculoskeletal: Negative for gait problem or joint swelling.  Skin: Negative for rash.  Neurological: Negative for dizziness or headache.  No other specific complaints in a complete review of systems (except as listed in HPI above).      Objective:    LMP 10/07/2022 (Exact Date)   Wt Readings from Last 3 Encounters:  10/19/22 215 lb (97.5 kg)  10/18/22 213 lb 10 oz (96.9 kg)  10/18/22 213 lb 12.8 oz (97 kg)    Physical Exam  Constitutional: Patient appears well-developed and well-nourished. Obese *** No distress.  HEENT: head atraumatic, normocephalic, pupils equal and reactive to light, ears ***, neck supple, throat within normal limits Cardiovascular: Normal rate, regular rhythm and normal heart sounds.  No murmur heard. No BLE edema. Pulmonary/Chest: Effort normal and breath sounds normal. No respiratory distress. Abdominal: Soft.  There is no tenderness. Psychiatric: Patient has a normal mood and affect. behavior is normal. Judgment and thought content normal.  Results for orders placed or performed during the hospital encounter of [id number]  Basic metabolic panel  Result Value Ref Range   Sodium 135 135 - 145 mmol/L   Potassium 3.7 3.5 - 5.1 mmol/L   Chloride 102 98 - 111 mmol/L   CO2 24 22 - 32 mmol/L   Glucose,  Bld 105 (H) 70 - 99 mg/dL   BUN 14 6 - 20 mg/dL   Creatinine, Ser 0.73 0.44 - 1.00 mg/dL   Calcium 9.5 8.9 - 10.3 mg/dL   GFR, Estimated >60 >60 mL/min   Anion gap 9 5 - 15  CBC  Result Value Ref Range   WBC 11.0 (H) 4.0 - 10.5 K/uL   RBC 4.66 3.87 - 5.11 MIL/uL   Hemoglobin 14.1 12.0 - 15.0 g/dL   HCT 41.4 36.0 - 46.0 %   MCV 88.8 80.0 - 100.0 fL   MCH 30.3 26.0 - 34.0 pg   MCHC 34.1 30.0 - 36.0 g/dL   RDW 12.2 11.5 - 15.5 %   Platelets 367 150 - 400 K/uL   nRBC 0.0 0.0 - 0.2 %  Brain natriuretic peptide  Result Value Ref Range   B Natriuretic Peptide 5.3 0.0 - 100.0 pg/mL  Troponin I (High Sensitivity)  Result Value Ref Range   Troponin I (High Sensitivity) 3 <18 ng/L      Assessment & Plan:   Problem List Items Addressed This Visit   None    Follow up plan: No follow-ups on file.

## 2022-10-19 NOTE — Progress Notes (Signed)
ReDS Vest / Clip - 10/19/22 1359       ReDS Vest / Clip   Station Marker B    Ruler Value 34    ReDS Value Range Low volume    ReDS Actual Value 29

## 2022-10-19 NOTE — Patient Instructions (Signed)
Start weighing daily and call for an overnight weight gain of 3 pounds or more or a weekly weight gain of more than 5 pounds. 0  Start taking the metoprolol as 1/2 tablet daily.    Take the lasix if you need it

## 2022-10-20 ENCOUNTER — Encounter: Payer: Self-pay | Admitting: Nurse Practitioner

## 2022-10-20 ENCOUNTER — Ambulatory Visit: Payer: 59 | Admitting: Nurse Practitioner

## 2022-10-20 VITALS — BP 128/82 | HR 86 | Temp 98.9°F | Resp 16 | Ht 64.0 in | Wt 214.2 lb

## 2022-10-20 DIAGNOSIS — E039 Hypothyroidism, unspecified: Secondary | ICD-10-CM

## 2022-10-20 DIAGNOSIS — R071 Chest pain on breathing: Secondary | ICD-10-CM | POA: Diagnosis not present

## 2022-10-20 MED ORDER — LEVOTHYROXINE SODIUM 50 MCG PO TABS: 50.0000 ug | ORAL_TABLET | Freq: Every day | ORAL | 0 refills | Status: DC

## 2022-10-20 NOTE — Assessment & Plan Note (Signed)
Increase levothyroxine to 50 mcg daily. Get labs rechecked in 6 weeks.

## 2022-10-21 ENCOUNTER — Encounter: Payer: Self-pay | Admitting: Nurse Practitioner

## 2022-10-25 ENCOUNTER — Other Ambulatory Visit: Payer: Self-pay | Admitting: Internal Medicine

## 2022-10-25 ENCOUNTER — Encounter: Payer: Self-pay | Admitting: Nurse Practitioner

## 2022-10-26 ENCOUNTER — Ambulatory Visit: Payer: 59 | Admitting: Internal Medicine

## 2022-10-26 ENCOUNTER — Encounter: Payer: Self-pay | Admitting: Internal Medicine

## 2022-10-26 VITALS — BP 118/70 | HR 88 | Temp 97.9°F | Resp 18 | Ht 64.0 in | Wt 212.6 lb

## 2022-10-26 DIAGNOSIS — E039 Hypothyroidism, unspecified: Secondary | ICD-10-CM | POA: Diagnosis not present

## 2022-10-26 DIAGNOSIS — R002 Palpitations: Secondary | ICD-10-CM

## 2022-10-26 DIAGNOSIS — Z13220 Encounter for screening for lipoid disorders: Secondary | ICD-10-CM

## 2022-10-26 DIAGNOSIS — R0982 Postnasal drip: Secondary | ICD-10-CM | POA: Diagnosis not present

## 2022-10-26 MED ORDER — LEVOTHYROXINE SODIUM 25 MCG PO TABS: ORAL_TABLET | ORAL | 0 refills | Status: DC

## 2022-10-26 NOTE — Patient Instructions (Addendum)
It was great seeing you today!  Plan discussed at today's visit: -EKG in the office showing normal sinus rhythm -Alternate Levothyroxine dose 25 mcg one day and 50 mcg the next  -Plan to recheck thyroid labs 4/17 - 4/24  -If you still are having symptoms please let me or Almyra Free know  -For allergies recommend Flonase nasal spray  Follow up in: as needed   Take care and let us know if you have any questions or concerns prior to your next visit.  Dr. Rosana Berger

## 2022-10-26 NOTE — Progress Notes (Signed)
Acute Office Visit  Subjective:     Patient ID: Tina Hill, female    DOB: [birth year], 47 y.o.   MRN: [id number]  Chief Complaint  Patient presents with   Palpitations    Heart racing    HPI Patient is in today for palpitations since increasing Levothyroxine dose last week. Patient has also been more stressed since being told she has heart failure based on a chest x-ray from 10/15/22 which showed mild pulmonary vascular congestion. Subsequent chest x-ray negative. Following with heart failure clinic but euvolemic, asymptomatic. Planning on echo in May.   Hypertension: -Medications: Metoprolol 12.5 mg, Lasix 20 mg (not taking currently) -Patient is compliant with above medications and reports no side effects. -Denies any SOB, CP, vision changes, LE edema or symptoms of hypotension  Hypothyroidism: -Medications: Levothyroxine increased to 50 mcg about 5 days ago -Denies weight changes, cold./heat intolerance, skin changes.  Had a mild globus sensation like she is swallowing mucus.  Denies painful or difficult swallow.  Has been told in the past that her thyroid was enlarged and is concerned about this. -Thyroid ultrasound 09/02/2018 showing heterogeneous thyroid without nodules. -Last TSH: 10/20/22 5.02    Review of Systems  Constitutional:  Negative for chills, fever and malaise/fatigue.  Respiratory:  Negative for shortness of breath.   Cardiovascular:  Positive for palpitations. Negative for chest pain and leg swelling.        Objective:    BP 118/70   Pulse 88   Temp 97.9 F (36.6 C)   Resp 18   Ht '5\' 4"'$  (1.626 m)   Wt 212 lb 9.6 oz (96.4 kg)   LMP 10/07/2022 (Exact Date)   SpO2 99%   BMI 36.49 kg/m  BP Readings from Last 3 Encounters:  10/26/22 118/70  10/20/22 128/82  10/19/22 (!) 145/85   Wt Readings from Last 3 Encounters:  10/26/22 212 lb 9.6 oz (96.4 kg)  10/20/22 214 lb 3.2 oz (97.2 kg)  10/19/22 215 lb (97.5 kg)      Physical  Exam Constitutional:      Appearance: Normal appearance.  HENT:     Head: Normocephalic and atraumatic.     Mouth/Throat:     Mouth: Mucous membranes are moist.     Comments: Postnasal drip present Eyes:     Conjunctiva/sclera: Conjunctivae normal.  Neck:     Comments: Thyroid feels diffusely enlarged but symmetric, no nodules palpated Cardiovascular:     Rate and Rhythm: Normal rate and regular rhythm.  Pulmonary:     Effort: Pulmonary effort is normal.     Breath sounds: Normal breath sounds.  Musculoskeletal:     Cervical back: No tenderness.     Right lower leg: No edema.     Left lower leg: No edema.  Lymphadenopathy:     Cervical: No cervical adenopathy.  Skin:    General: Skin is warm and dry.  Neurological:     General: No focal deficit present.     Mental Status: She is alert. Mental status is at baseline.  Psychiatric:        Mood and Affect: Mood normal.        Behavior: Behavior normal.     No results found for any visits on 10/26/22.      Assessment & Plan:   1. Palpitations: EKG in the office reassuring, normal sinus rhythm.  I did review her chest x-rays from her recent ER visits, not convinced this is a true heart  failure picture.  Obtain echo in May to rule out diagnosis.  - EKG 12-Lead  2. Hypothyroidism, unspecified type: Medication dose may be too high, exacerbating anxiety and palpitations.  For now plan to alternate levothyroxine 25 mcg and 50 mcg every other day.  Patient will return in 6 weeks to recheck thyroid labs.  - Thyroid Panel With TSH; Future - levothyroxine (SYNTHROID) 25 MCG tablet; Alternate between 25 mcg and 50 mcg every other day.  Dispense: 90 tablet; Refill: 0   3. Lipid screening: Patient was concerned about her cholesterol panel from last July, LDL slightly elevated at 103, however patient was not fasting at that time.  Will recheck lipid panel in 6 weeks with fasting labs.  - Lipid Profile; Future  4. Postnasal drip:  Recommend using an nasal spray to help with postnasal drip.  Return if symptoms worsen or fail to improve.  Teodora Medici, DO

## 2022-11-15 ENCOUNTER — Ambulatory Visit: Payer: 59

## 2022-11-22 ENCOUNTER — Ambulatory Visit: Payer: 59

## 2022-12-01 ENCOUNTER — Ambulatory Visit: Payer: 59 | Attending: Cardiology | Admitting: Cardiology

## 2022-12-01 ENCOUNTER — Encounter: Payer: Self-pay | Admitting: Cardiology

## 2022-12-01 ENCOUNTER — Encounter: Payer: 59 | Admitting: Family

## 2022-12-02 ENCOUNTER — Other Ambulatory Visit: Payer: Self-pay

## 2022-12-02 DIAGNOSIS — E039 Hypothyroidism, unspecified: Secondary | ICD-10-CM | POA: Diagnosis not present

## 2022-12-02 DIAGNOSIS — Z13220 Encounter for screening for lipoid disorders: Secondary | ICD-10-CM

## 2022-12-03 LAB — LIPID PANEL
Cholesterol: 211 mg/dL — ABNORMAL HIGH (ref <200)
HDL: 54 mg/dL (ref >=50)
LDL Cholesterol (Calc): 130 mg/dL (calc) — ABNORMAL HIGH
Non-HDL Cholesterol (Calc): 157 mg/dL (calc) — ABNORMAL HIGH (ref <130)
Total CHOL/HDL Ratio: 3.9 (calc) (ref <5.0)
Triglycerides: 154 mg/dL — ABNORMAL HIGH (ref <150)

## 2022-12-03 LAB — THYROID PANEL WITH TSH
Free Thyroxine Index: 2.3 (ref 1.4–3.8)
T3 Uptake: 29 % (ref 22–35)
T4, Total: 8 ug/dL (ref 5.1–11.9)
TSH: 2.68 mIU/L

## 2022-12-21 ENCOUNTER — Ambulatory Visit: Payer: 59

## 2022-12-29 ENCOUNTER — Encounter: Payer: 59 | Admitting: Family

## 2023-02-21 ENCOUNTER — Telehealth: Payer: Self-pay | Admitting: Nurse Practitioner

## 2023-02-21 NOTE — Telephone Encounter (Signed)
Copied from CRM [id number]. Topic: Appointment Scheduling - Scheduling Inquiry for Clinic >> Feb 21, 2023  3:57 PM [place] B wrote: Reason for CRM: pt called in wanting to know when she is supposed to come back for blood work. She says she was last there in April

## 2023-02-22 NOTE — Telephone Encounter (Signed)
Called patient and informed her she can have her blood work done during her November appt. Patient gave verbal understanding.

## 2023-06-21 ENCOUNTER — Encounter (INDEPENDENT_AMBULATORY_CARE_PROVIDER_SITE_OTHER): Payer: 59 | Admitting: Nurse Practitioner

## 2023-06-27 ENCOUNTER — Ambulatory Visit (INDEPENDENT_AMBULATORY_CARE_PROVIDER_SITE_OTHER): Payer: 59 | Admitting: Nurse Practitioner

## 2023-06-27 ENCOUNTER — Encounter: Payer: Self-pay | Admitting: Nurse Practitioner

## 2023-06-27 ENCOUNTER — Other Ambulatory Visit: Payer: Self-pay

## 2023-06-27 VITALS — BP 128/82 | HR 98 | Temp 97.9°F | Resp 16 | Ht 64.0 in | Wt 209.5 lb

## 2023-06-27 DIAGNOSIS — E039 Hypothyroidism, unspecified: Secondary | ICD-10-CM

## 2023-06-27 DIAGNOSIS — Z1211 Encounter for screening for malignant neoplasm of colon: Secondary | ICD-10-CM | POA: Diagnosis not present

## 2023-06-27 DIAGNOSIS — Z130 Encounter for screening for diseases of the blood and blood-forming organs and certain disorders involving the immune mechanism: Secondary | ICD-10-CM

## 2023-06-27 DIAGNOSIS — Z0000 Encounter for general adult medical examination without abnormal findings: Secondary | ICD-10-CM | POA: Diagnosis not present

## 2023-06-27 DIAGNOSIS — E785 Hyperlipidemia, unspecified: Secondary | ICD-10-CM | POA: Diagnosis not present

## 2023-06-27 DIAGNOSIS — Z131 Encounter for screening for diabetes mellitus: Secondary | ICD-10-CM

## 2023-06-27 DIAGNOSIS — Z1231 Encounter for screening mammogram for malignant neoplasm of breast: Secondary | ICD-10-CM | POA: Diagnosis not present

## 2023-06-27 NOTE — Progress Notes (Signed)
Name: Tina Hill   MRN: [id number]    DOB: 1976-02-02   Date:06/27/2023       Progress Note  Subjective  Chief Complaint  Chief Complaint  Patient presents with   Annual Exam    HPI  Patient presents for annual CPE.  Diet: Regular, well balanced diet Exercise: Gym 4 times a week 90 minutes  Last Eye Exam: 2023 Last Dental Exam: August 2024  Flowsheet Row Office Visit from 06/27/2023 in [facility]  AUDIT-C Score 0      Depression: Phq 9 is  negative    06/27/2023    9:01 AM 10/26/2022    9:42 AM 10/18/2022   10:30 AM 08/26/2022    2:49 PM 06/18/2022    9:13 AM  Depression screen PHQ 2/9  Decreased Interest 0 0 0 0 0  Down, Depressed, Hopeless 0 0 0 0 0  PHQ - 2 Score 0 0 0 0 0  Altered sleeping  0 0    Tired, decreased energy  0 0    Change in appetite  0 0    Feeling bad or failure about yourself   0 0    Trouble concentrating  0 0    Moving slowly or fidgety/restless  0 0    Suicidal thoughts  0 0    PHQ-9 Score  0 0    Difficult doing work/chores  Not difficult at all Not difficult at all     Hypertension: BP Readings from Last 3 Encounters:  06/27/23 128/82  10/26/22 118/70  10/20/22 128/82   Obesity: Wt Readings from Last 3 Encounters:  06/27/23 209 lb 8 oz (95 kg)  10/26/22 212 lb 9.6 oz (96.4 kg)  10/20/22 214 lb 3.2 oz (97.2 kg)   BMI Readings from Last 3 Encounters:  06/27/23 35.96 kg/m  10/26/22 36.49 kg/m  10/20/22 36.77 kg/m     Vaccines:  HPV: up to at age 11 , ask insurance if age between 46-45  Shingrix: 36-64 yo and ask insurance if covered when patient above 65 yo Pneumonia:  educated and discussed with patient. Flu:  educated and discussed with patient.     Hep C Screening: completed STD testing and prevention (HIV/chl/gon/syphilis): completed Intimate partner violence: negative screen  Menstrual History/LMP/Abnormal Bleeding: [facility]: 06/23/2023 Discussed importance of follow up if any  post-menopausal bleeding: yes  Incontinence Symptoms: negative for symptoms   Breast cancer:  - Last Mammogram: 04/26/2022 - BRCA gene screening: none  Osteoporosis Prevention : Discussed high calcium and vitamin D supplementation, weight bearing exercises Bone density :not applicable   Cervical cancer screening: 06/06/2019  Skin cancer: Discussed monitoring for atypical lesions  Colorectal cancer: 2021 , due ordered cologuard Lung cancer:  Low Dose CT Chest recommended if Age 74-80 years, 20 pack-year currently smoking OR have quit w/in 15years. Patient does not qualify for screen   ECG: 10/26/2022  Advanced Care Planning: A voluntary discussion about advance care planning including the explanation and discussion of advance directives.  Discussed health care proxy and Living will, and the patient was able to identify a health care proxy as husband.  Patient does not have a living will and power of attorney of health care   Lipids: Lab Results  Component Value Date   CHOL 211 (H) 12/02/2022   CHOL 179 02/15/2022   Lab Results  Component Value Date   HDL 54 12/02/2022   HDL 58 02/15/2022   Lab Results  Component Value Date  LDLCALC 130 (H) 12/02/2022   LDLCALC 101 (H) 02/15/2022   Lab Results  Component Value Date   TRIG 154 (H) 12/02/2022   TRIG 107 02/15/2022   Lab Results  Component Value Date   CHOLHDL 3.9 12/02/2022   CHOLHDL 3.1 02/15/2022   No results found for: "LDLDIRECT"  Glucose: Glucose, Bld  Date Value Ref Range Status  10/18/2022 105 (H) 70 - 99 mg/dL Final    Comment:    Glucose reference range applies only to samples taken after fasting for at least 8 hours.  10/18/2022 118 (H) 65 - 99 mg/dL Final    Comment:    .            Fasting reference interval . For someone without known diabetes, a glucose value between 100 and 125 mg/dL is consistent with prediabetes and should be confirmed with a follow-up test. .   10/15/2022 99 70 - 99 mg/dL  Final    Comment:    Glucose reference range applies only to samples taken after fasting for at least 8 hours.    Patient Active Problem List   Diagnosis Date Noted   Hyperlipidemia 06/27/2023   Obesity (BMI 30-39.9) 02/15/2022   Hypothyroidism 02/15/2022    Past Surgical History:  Procedure Laterality Date   COLPOSCOPY     none      Family History  Problem Relation Age of Onset   Breast cancer Mother 51   Hypertension Mother    Hypothyroidism Mother    Ovarian cancer Mother 60   Hypothyroidism Sister     Social History   Socioeconomic History   Marital status: Married    Spouse name: Not on file   Number of children: Not on file   Years of education: Not on file   Highest education level: Not on file  Occupational History   Not on file  Tobacco Use   Smoking status: Never   Smokeless tobacco: Never  Vaping Use   Vaping status: Never Used  Substance and Sexual Activity   Alcohol use: Yes    Comment: Occasionally   Drug use: Never   Sexual activity: Yes    Birth control/protection: None, Surgical    Comment: Vasectomy  Other Topics Concern   Not on file  Social History Narrative   Not on file   Social Determinants of Health   Financial Resource Strain: Low Risk  (06/27/2023)   Overall Financial Resource Strain (CARDIA)    Difficulty of Paying Living Expenses: Not hard at all  Food Insecurity: No Food Insecurity (06/27/2023)   Hunger Vital Sign    Worried About Running Out of Food in the Last Year: Never true    Ran Out of Food in the Last Year: Never true  Transportation Needs: No Transportation Needs (06/18/2022)   PRAPARE - Administrator, Civil Service (Medical): No    Lack of Transportation (Non-Medical): No  Physical Activity: Sufficiently Active (06/27/2023)   Exercise Vital Sign    Days of Exercise per Week: 4 days    Minutes of Exercise per Session: 90 min  Stress: No Stress Concern Present (06/27/2023)   [employer] of  Occupational Health - Occupational Stress Questionnaire    Feeling of Stress : Only a little  Social Connections: Moderately Integrated (06/27/2023)   Social Connection and Isolation Panel [NHANES]    Frequency of Communication with Friends and Family: More than three times a week    Frequency of Social Gatherings with  Friends and Family: More than three times a week    Attends Religious Services: 1 to 4 times per year    Active Member of Clubs or Organizations: No    Attends Banker Meetings: Never    Marital Status: Married  Catering manager Violence: Not At Risk (06/27/2023)   Humiliation, Afraid, Rape, and Kick questionnaire    Fear of Current or Ex-Partner: No    Emotionally Abused: No    Physically Abused: No    Sexually Abused: No     Current Outpatient Medications:    albuterol (VENTOLIN HFA) 108 (90 Base) MCG/ACT inhaler, Inhale 2 puffs into the lungs every 6 (six) hours as needed for wheezing or shortness of breath., Disp: 8 g, Rfl: 0   levothyroxine (SYNTHROID) 25 MCG tablet, Alternate between 25 mcg and 50 mcg every other day., Disp: 90 tablet, Rfl: 0   meloxicam (MOBIC) 15 MG tablet, Take 1 tablet (15 mg total) by mouth daily., Disp: 30 tablet, Rfl: 0  Allergies  Allergen Reactions   Macadamia Nut Oil     Difficulty breathing     ROS  Constitutional: Negative for fever or weight change.  Respiratory: Negative for cough and shortness of breath.   Cardiovascular: Negative for chest pain or palpitations.  Gastrointestinal: Negative for abdominal pain, no bowel changes.  Musculoskeletal: Negative for gait problem or joint swelling.  Skin: Negative for rash.  Neurological: Negative for dizziness or headache.  No other specific complaints in a complete review of systems (except as listed in HPI above).   Objective  Vitals:   06/27/23 0905  BP: 128/82  Pulse: 98  Resp: 16  Temp: 97.9 F (36.6 C)  TempSrc: Oral  SpO2: 98%  Weight: 209 lb 8 oz (95  kg)  Height: 5\' 4"  (1.626 m)    Body mass index is 35.96 kg/m.  Physical Exam  Constitutional: Patient appears well-developed and well-nourished. No distress.  HENT: Head: Normocephalic and atraumatic. Ears: B TMs ok, no erythema or effusion; Nose: Nose normal. Mouth/Throat: Oropharynx is clear and moist. No oropharyngeal exudate.  Eyes: Conjunctivae and EOM are normal. Pupils are equal, round, and reactive to light. No scleral icterus.  Neck: Normal range of motion. Neck supple. No JVD present. No thyromegaly present.  Cardiovascular: Normal rate, regular rhythm and normal heart sounds.  No murmur heard. No BLE edema. Pulmonary/Chest: Effort normal and breath sounds normal. No respiratory distress. Abdominal: Soft. Bowel sounds are normal, no distension. There is no tenderness. no masses Breast: no lumps or masses, no nipple discharge or rashes Musculoskeletal: Normal range of motion, no joint effusions. No gross deformities Neurological: he is alert and oriented to person, place, and time. No cranial nerve deficit. Coordination, balance, strength, speech and gait are normal.  Skin: Skin is warm and dry. No rash noted. No erythema.  Psychiatric: Patient has a normal mood and affect. behavior is normal. Judgment and thought content normal.  No results found for this or any previous visit (from the past 2160 hour(s)).   Fall Risk:    06/27/2023    9:00 AM 10/26/2022    9:42 AM 10/18/2022   10:29 AM 08/26/2022    2:48 PM 06/18/2022    9:13 AM  Fall Risk   Falls in the past year? 0 0 0 0 0  Number falls in past yr: 0 0 0 0 0  Injury with Fall? 0 0 0 0 0  Risk for fall due to : No Fall  Risks  No Fall Risks    Follow up Falls prevention discussed  Falls prevention discussed;Education provided;Falls evaluation completed Falls evaluation completed Falls evaluation completed     Functional Status Survey: Is the patient deaf or have difficulty hearing?: No Does the patient have  difficulty seeing, even when wearing glasses/contacts?: No Does the patient have difficulty concentrating, remembering, or making decisions?: No Does the patient have difficulty walking or climbing stairs?: No Does the patient have difficulty dressing or bathing?: No Does the patient have difficulty doing errands alone such as visiting a doctor's office or shopping?: No   Assessment & Plan  1. Annual physical exam  - CBC with Differential/Platelet - COMPLETE METABOLIC PANEL WITH GFR - Lipid panel - Hemoglobin A1c - TSH  2. Hyperlipidemia, unspecified hyperlipidemia type  - Lipid panel  3. Hypothyroidism, unspecified type  - TSH  4. Screening for diabetes mellitus  - COMPLETE METABOLIC PANEL WITH GFR - Hemoglobin A1c  5. Screening for deficiency anemia  - CBC with Differential/Platelet  6. Encounter for screening mammogram for malignant neoplasm of breast  - MM 3D SCREENING MAMMOGRAM BILATERAL BREAST; Future  7. Screening for colon cancer  - Cologuard    -USPSTF grade A and B recommendations reviewed with patient; age-appropriate recommendations, preventive care, screening tests, etc discussed and encouraged; healthy living encouraged; see AVS for patient education given to patient -Discussed importance of 150 minutes of physical activity weekly, eat two servings of fish weekly, eat one serving of tree nuts ( cashews, pistachios, pecans, almonds.Marland Kitchen) every other day, eat 6 servings of fruit/vegetables daily and drink plenty of water and avoid sweet beverages.   -Reviewed Health Maintenance: Yes.

## 2023-06-28 LAB — CBC WITH DIFFERENTIAL/PLATELET
Absolute Lymphocytes: 2025 {cells}/uL (ref 850–3900)
Absolute Monocytes: 570 {cells}/uL (ref 200–950)
Basophils Absolute: 60 {cells}/uL (ref 0–200)
Basophils Relative: 0.8 %
Eosinophils Absolute: 615 {cells}/uL — ABNORMAL HIGH (ref 15–500)
Eosinophils Relative: 8.2 %
HCT: 38.9 % (ref 35.0–45.0)
Hemoglobin: 12.8 g/dL (ref 11.7–15.5)
MCH: 30.7 pg (ref 27.0–33.0)
MCHC: 32.9 g/dL (ref 32.0–36.0)
MCV: 93.3 fL (ref 80.0–100.0)
MPV: 11.4 fL (ref 7.5–12.5)
Monocytes Relative: 7.6 %
Neutro Abs: 4230 {cells}/uL (ref 1500–7800)
Neutrophils Relative %: 56.4 %
Platelets: 333 10*3/uL (ref 140–400)
RBC: 4.17 10*6/uL (ref 3.80–5.10)
RDW: 11.6 % (ref 11.0–15.0)
Total Lymphocyte: 27 %
WBC: 7.5 10*3/uL (ref 3.8–10.8)

## 2023-06-28 LAB — HEMOGLOBIN A1C
Hgb A1c MFr Bld: 5.9 %{Hb} — ABNORMAL HIGH (ref <5.7)
Mean Plasma Glucose: 123 mg/dL
eAG (mmol/L): 6.8 mmol/L

## 2023-06-28 LAB — COMPLETE METABOLIC PANEL WITH GFR
AG Ratio: 1.2 (calc) (ref 1.0–2.5)
ALT: 18 U/L (ref 6–29)
AST: 22 U/L (ref 10–35)
Albumin: 3.8 g/dL (ref 3.6–5.1)
Alkaline phosphatase (APISO): 69 U/L (ref 31–125)
BUN: 10 mg/dL (ref 7–25)
CO2: 28 mmol/L (ref 20–32)
Calcium: 9.4 mg/dL (ref 8.6–10.2)
Chloride: 104 mmol/L (ref 98–110)
Creat: 0.65 mg/dL (ref 0.50–0.99)
Globulin: 3.1 g/dL (ref 1.9–3.7)
Glucose, Bld: 87 mg/dL (ref 65–99)
Potassium: 4.6 mmol/L (ref 3.5–5.3)
Sodium: 139 mmol/L (ref 135–146)
Total Bilirubin: 0.7 mg/dL (ref 0.2–1.2)
Total Protein: 6.9 g/dL (ref 6.1–8.1)
eGFR: 109 mL/min/{1.73_m2} (ref >=60)

## 2023-06-28 LAB — LIPID PANEL
Cholesterol: 205 mg/dL — ABNORMAL HIGH (ref <200)
HDL: 59 mg/dL (ref >=50)
LDL Cholesterol (Calc): 121 mg/dL — ABNORMAL HIGH
Non-HDL Cholesterol (Calc): 146 mg/dL — ABNORMAL HIGH (ref <130)
Total CHOL/HDL Ratio: 3.5 (calc) (ref <5.0)
Triglycerides: 131 mg/dL (ref <150)

## 2023-06-28 LAB — TSH: TSH: 1.87 m[IU]/L

## 2023-07-07 DIAGNOSIS — Z1211 Encounter for screening for malignant neoplasm of colon: Secondary | ICD-10-CM | POA: Diagnosis not present

## 2023-07-15 LAB — COLOGUARD: COLOGUARD: NEGATIVE

## 2023-08-03 ENCOUNTER — Other Ambulatory Visit: Payer: Self-pay | Admitting: Internal Medicine

## 2023-08-03 ENCOUNTER — Other Ambulatory Visit: Payer: Self-pay | Admitting: Nurse Practitioner

## 2023-08-03 DIAGNOSIS — E039 Hypothyroidism, unspecified: Secondary | ICD-10-CM

## 2023-08-03 MED ORDER — LEVOTHYROXINE SODIUM 25 MCG PO TABS: ORAL_TABLET | ORAL | 2 refills | Status: DC

## 2023-08-03 NOTE — Telephone Encounter (Signed)
Medication Refill -  Most Recent Primary Care Visit:  Provider: Della Goo F  Department: [facility]  Visit Type: PHYSICAL  Date: 06/27/2023  Medication: levothyroxine (SYNTHROID) 25 MCG tablet   Has the patient contacted their pharmacy? Yes (Agent: If yes, when and what did the pharmacy advise?) Pt out of refills, needs to contact office.   Is this the correct pharmacy for this prescription? Yes This is the patient's preferred pharmacy:  [id number] [place], [place] - [address] AT [facility] OF [address] [place] [address] Phone: [phone] Fax: [phone]   Has the prescription been filled recently? No  Is the patient out of the medication? Yes  Has the patient been seen for an appointment in the last year OR does the patient have an upcoming appointment? Yes  Can we respond through MyChart? Yes  Agent: Please be advised that Rx refills may take up to 3 business days. We ask that you follow-up with your pharmacy.

## 2023-08-03 NOTE — Telephone Encounter (Signed)
Requested Prescriptions  Pending Prescriptions Disp Refills   levothyroxine (SYNTHROID) 25 MCG tablet 135 tablet 2    Sig: Alternate between 25 mcg and 50 mcg every other day.     Endocrinology:  Hypothyroid Agents Passed - 08/03/2023  4:28 PM      Passed - TSH in normal range and within 360 days    TSH  Date Value Ref Range Status  06/27/2023 1.87 mIU/L Final    Comment:              Reference Range .           > or = 20 Years  0.40-4.50 .                Pregnancy Ranges           First trimester    0.26-2.66           Second trimester   0.55-2.73           Third trimester    0.43-2.91          Passed - Valid encounter within last 12 months    Recent Outpatient Visits           1 month ago Annual physical exam   [facility] Berniece Salines, FNP   9 months ago Palpitations   [facility] Margarita Mail, DO   9 months ago Hypothyroidism, unspecified type   [facility] Berniece Salines, FNP   9 months ago Chest pain on breathing   [facility] Mecum, Oswaldo Conroy, PA-C   11 months ago Upper back pain on left side   [facility] Berniece Salines, FNP       Future Appointments             In 11 months Zane Herald, Rudolpho Sevin, FNP [facility], [facility]

## 2023-08-03 NOTE — Telephone Encounter (Signed)
Already refilled today in a separate encounter, will refuse this request.  Requested Prescriptions  Pending Prescriptions Disp Refills   levothyroxine (SYNTHROID) 25 MCG tablet [Pharmacy Med Name: LEVOTHYROXINE 0.025MG  ( ) TAB] 90 tablet 0    Sig: ALTERNATE BETWEEN 25 MCG TABLET AND 50 MCG EVERY OTHER DAY     Endocrinology:  Hypothyroid Agents Passed - 08/03/2023  5:08 PM      Passed - TSH in normal range and within 360 days    TSH  Date Value Ref Range Status  06/27/2023 1.87 mIU/L Final    Comment:              Reference Range .           > or = 20 Years  0.40-4.50 .                Pregnancy Ranges           First trimester    0.26-2.66           Second trimester   0.55-2.73           Third trimester    0.43-2.91          Passed - Valid encounter within last 12 months    Recent Outpatient Visits           1 month ago Annual physical exam   [facility] Berniece Salines, FNP   9 months ago Palpitations   [facility] Margarita Mail, DO   9 months ago Hypothyroidism, unspecified type   [facility] Berniece Salines, FNP   9 months ago Chest pain on breathing   [facility] Mecum, Oswaldo Conroy, PA-C   11 months ago Upper back pain on left side   [facility] Berniece Salines, FNP       Future Appointments             In 11 months Zane Herald, Rudolpho Sevin, FNP [facility], [facility]

## 2023-08-04 ENCOUNTER — Telehealth: Payer: Self-pay | Admitting: Nurse Practitioner

## 2023-08-04 NOTE — Telephone Encounter (Signed)
Referral Request - Did the patient discuss referral with their provider in the last year? No Seen Tina Hill 4 weeks ago   Appointment offered? Yes  Type of order/referral and detailed reason for visit: Pt is requesting a referral for a hypothyroidism  Preference of office, provider, location: [place]  If referral order, have you been seen by this specialty before? No (If Yes, this issue or another issue? When? Where?  Can we respond through MyChart? Yes

## 2023-08-05 ENCOUNTER — Other Ambulatory Visit: Payer: Self-pay | Admitting: Nurse Practitioner

## 2023-08-05 DIAGNOSIS — Z5189 Encounter for other specified aftercare: Secondary | ICD-10-CM

## 2023-08-05 DIAGNOSIS — E039 Hypothyroidism, unspecified: Secondary | ICD-10-CM

## 2023-08-05 NOTE — Telephone Encounter (Signed)
She states just wants to be sure everything is ok  and wants to come off meds

## 2023-09-14 ENCOUNTER — Ambulatory Visit: Payer: 59 | Admitting: Nurse Practitioner

## 2023-09-14 NOTE — Progress Notes (Deleted)
 There were no vitals taken for this visit.   Subjective:    Patient ID: Tina Hill, female    DOB: August 15, 1976, 48 y.o.   MRN: [id number]  HPI: Tina Hill is a 48 y.o. female  No chief complaint on file.   Discussed the use of AI scribe software for clinical note transcription with the patient, who gave verbal consent to proceed.  History of Present Illness           06/27/2023    9:01 AM 10/26/2022    9:42 AM 10/18/2022   10:30 AM  Depression screen PHQ 2/9  Decreased Interest 0 0 0  Down, Depressed, Hopeless 0 0 0  PHQ - 2 Score 0 0 0  Altered sleeping  0 0  Tired, decreased energy  0 0  Change in appetite  0 0  Feeling bad or failure about yourself   0 0  Trouble concentrating  0 0  Moving slowly or fidgety/restless  0 0  Suicidal thoughts  0 0  PHQ-9 Score  0 0  Difficult doing work/chores  Not difficult at all Not difficult at all    Relevant past medical, surgical, family and social history reviewed and updated as indicated. Interim medical history since our last visit reviewed. Allergies and medications reviewed and updated.  Review of Systems  Per HPI unless specifically indicated above     Objective:    There were no vitals taken for this visit.  {Vitals History (Optional):23777} Wt Readings from Last 3 Encounters:  06/27/23 209 lb 8 oz (95 kg)  10/26/22 212 lb 9.6 oz (96.4 kg)  10/20/22 214 lb 3.2 oz (97.2 kg)    Physical Exam  Results for orders placed or performed in visit on 06/27/23  CBC with Differential/Platelet   Collection Time: 06/27/23  9:24 AM  Result Value Ref Range   WBC 7.5 3.8 - 10.8 Thousand/uL   RBC 4.17 3.80 - 5.10 Million/uL   Hemoglobin 12.8 11.7 - 15.5 g/dL   HCT 78.2 95.6 - 21.3 %   MCV 93.3 80.0 - 100.0 fL   MCH 30.7 27.0 - 33.0 pg   MCHC 32.9 32.0 - 36.0 g/dL   RDW 08.6 57.8 - 46.9 %   Platelets 333 140 - 400 Thousand/uL   MPV 11.4 7.5 - 12.5 fL   Neutro Abs 4,230 1,500 - 7,800 cells/uL   Absolute  Lymphocytes 2,025 850 - 3,900 cells/uL   Absolute Monocytes 570 200 - 950 cells/uL   Eosinophils Absolute 615 (H) 15 - 500 cells/uL   Basophils Absolute 60 0 - 200 cells/uL   Neutrophils Relative % 56.4 %   Total Lymphocyte 27.0 %   Monocytes Relative 7.6 %   Eosinophils Relative 8.2 %   Basophils Relative 0.8 %  COMPLETE METABOLIC PANEL WITH GFR   Collection Time: 06/27/23  9:24 AM  Result Value Ref Range   Glucose, Bld 87 65 - 99 mg/dL   BUN 10 7 - 25 mg/dL   Creat 6.29 5.28 - 4.13 mg/dL   eGFR 244 > OR = 60 WN/UUV/2.53G6   BUN/Creatinine Ratio SEE NOTE: 6 - 22 (calc)   Sodium 139 135 - 146 mmol/L   Potassium 4.6 3.5 - 5.3 mmol/L   Chloride 104 98 - 110 mmol/L   CO2 28 20 - 32 mmol/L   Calcium 9.4 8.6 - 10.2 mg/dL   Total Protein 6.9 6.1 - 8.1 g/dL   Albumin 3.8 3.6 - 5.1 g/dL  Globulin 3.1 1.9 - 3.7 g/dL (calc)   AG Ratio 1.2 1.0 - 2.5 (calc)   Total Bilirubin 0.7 0.2 - 1.2 mg/dL   Alkaline phosphatase (APISO) 69 31 - 125 U/L   AST 22 10 - 35 U/L   ALT 18 6 - 29 U/L  Lipid panel   Collection Time: 06/27/23  9:24 AM  Result Value Ref Range   Cholesterol 205 (H) <200 mg/dL   HDL 59 > OR = 50 mg/dL   Triglycerides 161 <096 mg/dL   LDL Cholesterol (Calc) 121 (H) mg/dL (calc)   Total CHOL/HDL Ratio 3.5 <5.0 (calc)   Non-HDL Cholesterol (Calc) 146 (H) <130 mg/dL (calc)  Hemoglobin E4V   Collection Time: 06/27/23  9:24 AM  Result Value Ref Range   Hgb A1c MFr Bld 5.9 (H) <5.7 % of total Hgb   Mean Plasma Glucose 123 mg/dL   eAG (mmol/L) 6.8 mmol/L  TSH   Collection Time: 06/27/23  9:24 AM  Result Value Ref Range   TSH 1.87 mIU/L  Cologuard   Collection Time: 07/07/23 11:19 AM  Result Value Ref Range   COLOGUARD Negative Negative   {Labs (Optional):23779}    Assessment & Plan:   Problem List Items Addressed This Visit   None    Assessment and Plan             Follow up plan: No follow-ups on file.

## 2023-11-22 ENCOUNTER — Encounter: Payer: Self-pay | Admitting: Endocrinology

## 2023-11-22 ENCOUNTER — Ambulatory Visit (INDEPENDENT_AMBULATORY_CARE_PROVIDER_SITE_OTHER): Payer: 59 | Admitting: Endocrinology

## 2023-11-22 VITALS — BP 128/86 | HR 91 | Ht 64.0 in | Wt 207.6 lb

## 2023-11-22 DIAGNOSIS — E038 Other specified hypothyroidism: Secondary | ICD-10-CM

## 2023-11-22 NOTE — Progress Notes (Unsigned)
 Outpatient Endocrinology Note [place] Dailyn Kempner, MD   Patient's Name: Tina Hill    DOB: 16-Nov-1975    MRN: [id number]  REASON OF VISIT: New consult for hypothyroidism  REFERRING PROVIDER: Berniece Salines, FNP  PCP: Berniece Salines, FNP  HISTORY OF PRESENT ILLNESS:   Tina Hill is a 48 y.o. old female with past medical history as listed below is presented for a new consult for hypothyroidism.   Pertinent Thyroid History: Patient was diagnosed with hypothyroidism around 2020, was started on thyroid hormone replacement levothyroxine low-dose 25 mcg daily.  She had ultrasound thyroid in July 04, 2019 at [facility] health heterogenous thyroid with increased vascularity likely representing thyroiditis, Hashimoto's thyroiditis, no suspicious thyroid nodules.  In March 2024 she had mildly elevated TSH, levothyroxine was increased and then has been gradually decreased, in November 2024 she had TSH of 1.87 and at that time she was taking levothyroxine 25 mcg every other day.  Patient reports complaints of having elevated blood pressure and nausea while on higher dose of levothyroxine. -Patient has family history of hypothyroidism in mother and grandmother.  She had no neck compressive symptoms. -Denies palpitation or heat intolerance.  Occasionally feels cold intolerance and constipation.   Interval history She is currently taking levothyroxine 25 mcg every third day, she had nausea and elevated blood pressure on higher dose of levothyroxine.  She has been taking reduced dose of levothyroxine from the beginning of March.  She has never stopped levothyroxine completely.  She has been following with holistic provider and has been on different supplements as well.  She denies taking biotin.  Labs reviewed as follows.   Latest Reference Range & Units 02/15/22 09:07 10/18/22 11:09 12/02/22 08:54 06/27/23 09:24  TSH mIU/L 2.82 5.02 (H) 2.68 1.87  Thyroxine (T4) 5.1 - 11.9 mcg/dL   8.0   Free  Thyroxine Index 1.4 - 3.8    2.3   T3 Uptake 22 - 35 %   29   (H): Data is abnormally high  REVIEW OF SYSTEMS:  As per history of present illness.   PAST MEDICAL HISTORY: Past Medical History:  Diagnosis Date   Asthma    CHF (congestive heart failure) (HCC)    Family history of breast cancer    Family history of ovarian cancer    Hypertension    Ovarian cyst    Pap smear abnormality of cervix with LGSIL     PAST SURGICAL HISTORY: Past Surgical History:  Procedure Laterality Date   COLPOSCOPY     none      ALLERGIES: Allergies  Allergen Reactions   Macadamia Nut Oil     Difficulty breathing    FAMILY HISTORY:  Family History  Problem Relation Age of Onset   Breast cancer Mother 48   Hypertension Mother    Hypothyroidism Mother    Ovarian cancer Mother 3   Hypothyroidism Sister     SOCIAL HISTORY: Social History   Socioeconomic History   Marital status: Married    Spouse name: Not on file   Number of children: Not on file   Years of education: Not on file   Highest education level: Not on file  Occupational History   Not on file  Tobacco Use   Smoking status: Never   Smokeless tobacco: Never  Vaping Use   Vaping status: Never Used  Substance and Sexual Activity   Alcohol use: Yes    Comment: Occasionally   Drug use: Never   Sexual  activity: Yes    Birth control/protection: None, Surgical    Comment: Vasectomy  Other Topics Concern   Not on file  Social History Narrative   Not on file   Social Drivers of Health   Financial Resource Strain: Low Risk  (06/27/2023)   Overall Financial Resource Strain (CARDIA)    Difficulty of Paying Living Expenses: Not hard at all  Food Insecurity: No Food Insecurity (06/27/2023)   Hunger Vital Sign    Worried About Running Out of Food in the Last Year: Never true    Ran Out of Food in the Last Year: Never true  Transportation Needs: No Transportation Needs (06/18/2022)   PRAPARE - Scientist, research (physical sciences) (Medical): No    Lack of Transportation (Non-Medical): No  Physical Activity: Sufficiently Active (06/27/2023)   Exercise Vital Sign    Days of Exercise per Week: 4 days    Minutes of Exercise per Session: 90 min  Stress: No Stress Concern Present (06/27/2023)   [employer] of Occupational Health - Occupational Stress Questionnaire    Feeling of Stress : Only a little  Social Connections: Moderately Integrated (06/27/2023)   Social Connection and Isolation Panel [NHANES]    Frequency of Communication with Friends and Family: More than three times a week    Frequency of Social Gatherings with Friends and Family: More than three times a week    Attends Religious Services: 1 to 4 times per year    Active Member of [employer] or Organizations: No    Attends Banker Meetings: Never    Marital Status: Married    MEDICATIONS:  Current Outpatient Medications  Medication Sig Dispense Refill   albuterol (VENTOLIN HFA) 108 (90 Base) MCG/ACT inhaler Inhale 2 puffs into the lungs every 6 (six) hours as needed for wheezing or shortness of breath. (Patient not taking: Reported on 11/22/2023) 8 g 0   levothyroxine (SYNTHROID) 25 MCG tablet Alternate between 25 mcg and 50 mcg every other day. 135 tablet 2   No current facility-administered medications for this visit.    PHYSICAL EXAM: Vitals:   11/22/23 1414  BP: 128/86  Pulse: 91  SpO2: 98%  Weight: 207 lb 9.6 oz (94.2 kg)  Height: 5\' 4"  (1.626 m)   Body mass index is 35.63 kg/m.  Wt Readings from Last 3 Encounters:  11/22/23 207 lb 9.6 oz (94.2 kg)  06/27/23 209 lb 8 oz (95 kg)  10/26/22 212 lb 9.6 oz (96.4 kg)    General: Well developed, well nourished female in no apparent distress.  HEENT: AT/[place], no external lesions. Hearing intact to the spoken word Eyes: EOMI. No stare, proptosis or lid lag. Conjunctiva clear and no icterus. Neck: Trachea midline, neck supple without appreciable thyromegaly or  lymphadenopathy and ? palpable isthmus thyroid nodule  Lungs: Clear to auscultation, no wheeze. Respirations not labored Heart: S1S2, Regular in rate and rhythm. No loud murmurs Abdomen: Soft, non tender, non distended Neurologic: Alert, oriented, normal speech, deep tendon biceps reflexes normal,  no gross focal neurological deficit Extremities: No pedal pitting edema, no tremors of outstretched hands Skin: Warm, color good.  Psychiatric: Does not appear depressed or anxious  PERTINENT HISTORIC LABORATORY AND IMAGING STUDIES:  All pertinent laboratory results were reviewed. Please see HPI also for further details.   TSH  Date Value Ref Range Status  06/27/2023 1.87 mIU/L Final    Comment:  Reference Range .           > or = 20 Years  0.40-4.50 .                Pregnancy Ranges           First trimester    0.26-2.66           Second trimester   0.55-2.73           Third trimester    0.43-2.91   12/02/2022 2.68 mIU/L Final    Comment:              Reference Range .           > or = 20 Years  0.40-4.50 .                Pregnancy Ranges           First trimester    0.26-2.66           Second trimester   0.55-2.73           Third trimester    0.43-2.91   10/18/2022 5.02 (H) mIU/L Final    Comment:              Reference Range .           > or = 20 Years  0.40-4.50 .                Pregnancy Ranges           First trimester    0.26-2.66           Second trimester   0.55-2.73           Third trimester    0.43-2.91      ASSESSMENT / PLAN  1. Subclinical hypothyroidism    Patient has subclinical hypothyroidism.  Diagnosed around 2020 and has been on low-dose of levothyroxine.  Currently she has been taking levothyroxine 25 mcg every third day.  Overall no obvious hypo and hyperthyroid symptoms.  She had ultrasound thyroid in [facility] in November 2020 consistent with heterogenous thyroid parenchyma with Hashimoto thyroiditis.  Plan: -It is possible that  she has hypothyroidism secondary to Hashimoto's thyroiditis.  I would like to check thyroid autoantibody thyroid peroxidase and thyroglobulin antibody today. -She is on very low-dose of levothyroxine taking 25 mcg every third day, if her thyroid function test is normal today we will consider holding levothyroxine completely. -Possible isthmus thyroid nodule on physical exam, will consider ultrasound thyroid in the future. -Check thyroid function test today.  [place] was seen today for new patient (initial visit).  Diagnoses and all orders for this visit:  Subclinical hypothyroidism -     T4, free -     TSH -     Thyroid peroxidase antibody -     Thyroglobulin antibody   Normal thyroid function test.  Stop levothyroxine and check TSH, free T4 prior to follow-up visit in June.   Latest Reference Range & Units 11/22/23 15:03  TSH mIU/L 2.44  T4,Free(Direct) 0.8 - 1.8 ng/dL 1.1    DISPOSITION Follow up in clinic in 2 months suggested.  All questions answered and patient verbalized understanding of the plan.  [place] Brahm Barbeau, MD [facility] Endocrinology [facility] [address] [place], [address] [place], [place] [address] Phone # [phone]  At least part of this note was generated using voice recognition software. Inadvertent word errors may have occurred, which were  not recognized during the proofreading process.

## 2023-11-23 ENCOUNTER — Encounter: Payer: Self-pay | Admitting: Endocrinology

## 2023-11-23 ENCOUNTER — Telehealth: Payer: Self-pay

## 2023-11-23 LAB — THYROID PEROXIDASE ANTIBODY: Thyroperoxidase Ab SerPl-aCnc: 5 [IU]/mL (ref <9)

## 2023-11-23 LAB — THYROGLOBULIN ANTIBODY: Thyroglobulin Ab: <1 [IU]/mL (ref <=1)

## 2023-11-23 LAB — T4, FREE: Free T4: 1.1 ng/dL (ref 0.8–1.8)

## 2023-11-23 LAB — TSH: TSH: 2.44 m[IU]/L

## 2023-11-23 NOTE — Telephone Encounter (Signed)
 Patient has no questions concerning labs and medication adjustments, lab appointment has been scheduled per patient.

## 2023-11-23 NOTE — Telephone Encounter (Signed)
-----   Message from Tina Hill sent at 11/23/2023  5:22 AM EDT ----- Please notify patient of normal thyroid function test.  Stop levothyroxine and check TSH, free T4 prior to follow-up visit in June as scheduled.

## 2024-01-23 ENCOUNTER — Ambulatory Visit: Payer: Self-pay | Admitting: Endocrinology

## 2024-01-23 ENCOUNTER — Other Ambulatory Visit: Payer: Self-pay

## 2024-01-24 ENCOUNTER — Ambulatory Visit: Payer: Self-pay | Admitting: Endocrinology

## 2024-01-24 LAB — TSH: TSH: 2.74 m[IU]/L

## 2024-01-24 LAB — T4, FREE: Free T4: 1.1 ng/dL (ref 0.8–1.8)

## 2024-01-26 ENCOUNTER — Encounter: Payer: Self-pay | Admitting: Endocrinology

## 2024-01-26 ENCOUNTER — Ambulatory Visit (INDEPENDENT_AMBULATORY_CARE_PROVIDER_SITE_OTHER): Payer: Self-pay | Admitting: Endocrinology

## 2024-01-26 VITALS — BP 112/80 | HR 83 | Resp 20 | Ht 64.0 in | Wt 194.0 lb

## 2024-01-26 DIAGNOSIS — E038 Other specified hypothyroidism: Secondary | ICD-10-CM

## 2024-01-26 NOTE — Progress Notes (Signed)
 Outpatient Endocrinology Note [place] Colin Norment, MD   Patient's Name: Tina Hill    DOB: 1976-02-10    MRN: [id number]  REASON OF VISIT: Follow up for hypothyroidism  REFERRING PROVIDER: Quinton Buckler, FNP  PCP: Quinton Buckler, FNP  HISTORY OF PRESENT ILLNESS:   Tina Hill is a 48 y.o. old female with past medical history as listed below is presented for a follow-up for hypothyroidism.   Pertinent Thyroid  History: Patient was diagnosed with hypothyroidism around 2020, was started on thyroid  hormone replacement levothyroxine  low-dose 25 mcg daily.  She had ultrasound thyroid  in July 04, 2019 at [facility] health heterogenous thyroid  with increased vascularity likely representing thyroiditis, Hashimoto's thyroiditis, no suspicious thyroid  nodules.  In March 2024 she had mildly elevated TSH, levothyroxine  was increased and then has been gradually decreased, in November 2024 she had TSH of 1.87 and at that time she was taking levothyroxine  25 mcg every other day.  Patient reports complaints of having elevated blood pressure and nausea while on higher dose of levothyroxine . -Patient has family history of hypothyroidism in mother and grandmother.  She had no neck compressive symptoms. -Denies palpitation or heat intolerance.  Occasionally feels cold intolerance and constipation. -In the initial visit in April 2025 she was taking levothyroxine  25 mcg every 3 days and was discontinued.  Thyroid  peroxidase antibody and thyroglobulin antibody were normal in April 2025.   Interval history Levothyroxine  was stopped on last visit in April.  She denies any new symptoms after not taking levothyroxine .  Overall feeling normal energy.  No constipation or cold intolerance.  No fatigue.  Recent thyroid  function test normal as follows without taking levothyroxine  for 2 months.    Latest Reference Range & Units 01/23/24 10:05  TSH mIU/L 2.74  T4,Free(Direct) 0.8 - 1.8 ng/dL 1.1    REVIEW OF  SYSTEMS:  As per history of present illness.   PAST MEDICAL HISTORY: Past Medical History:  Diagnosis Date   Asthma    CHF (congestive heart failure) (HCC)    Family history of breast cancer    Family history of ovarian cancer    Hypertension    Ovarian cyst    Pap smear abnormality of cervix with LGSIL     PAST SURGICAL HISTORY: Past Surgical History:  Procedure Laterality Date   COLPOSCOPY     none      ALLERGIES: Allergies  Allergen Reactions   Macadamia Nut Oil     Difficulty breathing    FAMILY HISTORY:  Family History  Problem Relation Age of Onset   Breast cancer Mother 4   Hypertension Mother    Hypothyroidism Mother    Ovarian cancer Mother 52   Hypothyroidism Sister     SOCIAL HISTORY: Social History   Socioeconomic History   Marital status: Married    Spouse name: Not on file   Number of children: Not on file   Years of education: Not on file   Highest education level: Not on file  Occupational History   Not on file  Tobacco Use   Smoking status: Never   Smokeless tobacco: Never  Vaping Use   Vaping status: Never Used  Substance and Sexual Activity   Alcohol use: Yes    Comment: Occasionally   Drug use: Never   Sexual activity: Yes    Birth control/protection: None, Surgical    Comment: Vasectomy  Other Topics Concern   Not on file  Social History Narrative   Not on file  Social Drivers of Corporate investment banker Strain: Low Risk  (06/27/2023)   Overall Financial Resource Strain (CARDIA)    Difficulty of Paying Living Expenses: Not hard at all  Food Insecurity: No Food Insecurity (06/27/2023)   Hunger Vital Sign    Worried About Running Out of Food in the Last Year: Never true    Ran Out of Food in the Last Year: Never true  Transportation Needs: No Transportation Needs (06/18/2022)   PRAPARE - Administrator, Civil Service (Medical): No    Lack of Transportation (Non-Medical): No  Physical Activity:  Sufficiently Active (06/27/2023)   Exercise Vital Sign    Days of Exercise per Week: 4 days    Minutes of Exercise per Session: 90 min  Stress: No Stress Concern Present (06/27/2023)   [employer] of Occupational Health - Occupational Stress Questionnaire    Feeling of Stress : Only a little  Social Connections: Moderately Integrated (06/27/2023)   Social Connection and Isolation Panel    Frequency of Communication with Friends and Family: More than three times a week    Frequency of Social Gatherings with Friends and Family: More than three times a week    Attends Religious Services: 1 to 4 times per year    Active Member of [employer] or Organizations: No    Attends Banker Meetings: Never    Marital Status: Married    MEDICATIONS:  Current Outpatient Medications  Medication Sig Dispense Refill   albuterol  (VENTOLIN  HFA) 108 (90 Base) MCG/ACT inhaler Inhale 2 puffs into the lungs every 6 (six) hours as needed for wheezing or shortness of breath. (Patient not taking: Reported on 01/26/2024) 8 g 0   No current facility-administered medications for this visit.    PHYSICAL EXAM: Vitals:   01/26/24 1511  BP: 112/80  Pulse: 83  Resp: 20  SpO2: 98%  Weight: 194 lb (88 kg)  Height: 5' 4 (1.626 m)   Body mass index is 33.3 kg/m.  Wt Readings from Last 3 Encounters:  01/26/24 194 lb (88 kg)  11/22/23 207 lb 9.6 oz (94.2 kg)  06/27/23 209 lb 8 oz (95 kg)    General: Well developed, well nourished female in no apparent distress.  HEENT: AT/[place], no external lesions. Hearing intact to the spoken word Eyes: EOMI. No stare, proptosis or lid lag. Conjunctiva clear and no icterus. Neck: Trachea midline, neck supple without appreciable thyromegaly or lymphadenopathy and ? palpable isthmus thyroid  nodule  Lungs: Clear to auscultation, no wheeze. Respirations not labored Heart: S1S2, Regular in rate and rhythm. No loud murmurs Abdomen: Soft, non tender, non  distended Neurologic: Alert, oriented, normal speech, deep tendon biceps reflexes normal,  no gross focal neurological deficit Extremities: No pedal pitting edema, no tremors of outstretched hands Skin: Warm, color good.  Psychiatric: Does not appear depressed or anxious  PERTINENT HISTORIC LABORATORY AND IMAGING STUDIES:  All pertinent laboratory results were reviewed. Please see HPI also for further details.   TSH  Date Value Ref Range Status  01/23/2024 2.74 mIU/L Final    Comment:              Reference Range .           > or = 20 Years  0.40-4.50 .                Pregnancy Ranges           First trimester    0.26-2.66  Second trimester   0.55-2.73           Third trimester    0.43-2.91   11/22/2023 2.44 mIU/L Final    Comment:              Reference Range .           > or = 20 Years  0.40-4.50 .                Pregnancy Ranges           First trimester    0.26-2.66           Second trimester   0.55-2.73           Third trimester    0.43-2.91   06/27/2023 1.87 mIU/L Final    Comment:              Reference Range .           > or = 20 Years  0.40-4.50 .                Pregnancy Ranges           First trimester    0.26-2.66           Second trimester   0.55-2.73           Third trimester    0.43-2.91     Latest Reference Range & Units 11/22/23 15:03  Thyroglobulin Ab < or = 1 IU/mL <1  Thyroperoxidase Ab SerPl-aCnc <9 IU/mL 5    ASSESSMENT / PLAN  1. Subclinical hypothyroidism    Patient had subclinical hypothyroidism.  Diagnosed around 2020 and has been on low-dose of levothyroxine .  Levothyroxine  was gradually decreased over time.  In April 2025 she was taking levothyroxine  25 mcg every 3 days and was stopped at that time. -She had ultrasound thyroid  in [facility] health in November 2020 consistent with heterogenous thyroid  parenchyma with Hashimoto thyroiditis. - She had normal thyroid  function test off of levothyroxine  for 2 months.  She is clinically  euthyroid today.  Plan: -No need of thyroid  hormone replacement. -Recommend to monitor thyroid  function test periodically, annually is reasonable. -Discussed about hypo and hyperthyroid symptoms and if she develops asked to call our clinic and we will check thyroid  function test at that time. -Check thyroid  function test in 1 year. -Possible isthmus thyroid  nodule on physical exam, will consider ultrasound thyroid  in the future. - Follow-up in 1 year.  Diagnoses and all orders for this visit:  Subclinical hypothyroidism -     T4, free -     TSH    DISPOSITION Follow up in clinic in 12 months suggested.  All questions answered and patient verbalized understanding of the plan.  [place] Guy Toney, MD [facility] [address] [place], [address] [place], [place] [address] Phone # [phone]  At least part of this note was generated using voice recognition software. Inadvertent word errors may have occurred, which were not recognized during the proofreading process.

## 2024-06-28 ENCOUNTER — Encounter: Payer: Self-pay | Admitting: Nurse Practitioner

## 2024-06-28 NOTE — Progress Notes (Deleted)
 Tina Hill   MRN: [id number]    DOB: 23-Nov-1975   Date:06/28/2024       Progress Note  Subjective  Chief Complaint  No chief complaint on file.   HPI  Patient presents for annual CPE.  Diet: *** Exercise: ***  Sleep: *** Last dental exam:*** Last eye exam: ***  Flowsheet Row Office Visit from 06/27/2023 in [facility]  AUDIT-C Score 0   Depression: Phq 9 is  {Desc; negative/positive:13464}    06/27/2023    9:01 AM 10/26/2022    9:42 AM 10/18/2022   10:30 AM 08/26/2022    2:49 PM 06/18/2022    9:13 AM  Depression screen PHQ 2/9  Decreased Interest 0 0 0 0 0  Down, Depressed, Hopeless 0 0 0 0 0  PHQ - 2 Score 0 0 0 0 0  Altered sleeping  0 0    Tired, decreased energy  0 0    Change in appetite  0 0    Feeling bad or failure about yourself   0 0    Trouble concentrating  0 0    Moving slowly or fidgety/restless  0 0    Suicidal thoughts  0 0    PHQ-9 Score  0  0     Difficult doing work/chores  Not difficult at all Not difficult at all       Data saved with a previous flowsheet row definition   Hypertension: BP Readings from Last 3 Encounters:  01/26/24 112/80  11/22/23 128/86  06/27/23 128/82   Obesity: Wt Readings from Last 3 Encounters:  01/26/24 194 lb (88 kg)  11/22/23 207 lb 9.6 oz (94.2 kg)  06/27/23 209 lb 8 oz (95 kg)   BMI Readings from Last 3 Encounters:  01/26/24 33.30 kg/m  11/22/23 35.63 kg/m  06/27/23 35.96 kg/m     Vaccines:  HPV: up to at age 33 , ask insurance if age between 39-45  Shingrix: 59-64 yo and ask insurance if covered when patient above 18 yo Pneumonia:  educated and discussed with patient. Flu: *** educated and discussed with patient.  Hep C Screening: completed  STD testing and prevention (HIV/chl/gon/syphilis): completed  Intimate partner violence:none Sexual History : Menstrual History/LMP/Abnormal Bleeding:  Incontinence Symptoms:   Breast cancer:  - Last Mammogram:  05/13/2022 *Due for a mammogram  - BRCA gene screening: none  Osteoporosis: Discussed high calcium and vitamin D  supplementation, weight bearing exercises  Cervical cancer screening: Due today   Skin cancer: Discussed monitoring for atypical lesions  Colorectal cancer: Cologuard 07/07/2023   Lung cancer:  Does not qualify Low Dose CT Chest recommended if Age 70-80 years, 20 pack-year currently smoking OR have quit w/in 15years. Patient does not qualify.   ECG: 10/26/2022  Advanced Care Planning: A voluntary discussion about advance care planning including the explanation and discussion of advance directives.  Discussed health care proxy and Living will, and the patient was able to identify a health care proxy as ***.  Patient {DOES_DOES WNU:81435} have a living will at present time. If patient does have living will, I have requested they bring this to the clinic to be scanned in to their chart.  Lipids: Lab Results  Component Value Date   CHOL 205 (H) 06/27/2023   CHOL 211 (H) 12/02/2022   CHOL 179 02/15/2022   Lab Results  Component Value Date   HDL 59 06/27/2023   HDL 54 12/02/2022   HDL 58 02/15/2022  Lab Results  Component Value Date   LDLCALC 121 (H) 06/27/2023   LDLCALC 130 (H) 12/02/2022   LDLCALC 101 (H) 02/15/2022   Lab Results  Component Value Date   TRIG 131 06/27/2023   TRIG 154 (H) 12/02/2022   TRIG 107 02/15/2022   Lab Results  Component Value Date   CHOLHDL 3.5 06/27/2023   CHOLHDL 3.9 12/02/2022   CHOLHDL 3.1 02/15/2022   No results found for: LDLDIRECT  Glucose: Glucose, Bld  Date Value Ref Range Status  06/27/2023 87 65 - 99 mg/dL Final    Comment:    .            Fasting reference interval .   10/18/2022 105 (H) 70 - 99 mg/dL Final    Comment:    Glucose reference range applies only to samples taken after fasting for at least 8 hours.  10/18/2022 118 (H) 65 - 99 mg/dL Final    Comment:    .            Fasting reference  interval . For someone without known diabetes, a glucose value between 100 and 125 mg/dL is consistent with prediabetes and should be confirmed with a follow-up test. .     Patient Active Problem List   Diagnosis Date Noted   Hyperlipidemia 06/27/2023   Obesity (BMI 30-39.9) 02/15/2022   Hypothyroidism 02/15/2022    Past Surgical History:  Procedure Laterality Date   COLPOSCOPY     none      Family History  Problem Relation Age of Onset   Breast cancer Mother 68   Hypertension Mother    Hypothyroidism Mother    Ovarian cancer Mother 28   Hypothyroidism Sister     Social History   Socioeconomic History   Marital status: Married    Spouse name: Not on file   Number of children: Not on file   Years of education: Not on file   Highest education level: Not on file  Occupational History   Not on file  Tobacco Use   Smoking status: Never   Smokeless tobacco: Never  Vaping Use   Vaping status: Never Used  Substance and Sexual Activity   Alcohol use: Yes    Comment: Occasionally   Drug use: Never   Sexual activity: Yes    Birth control/protection: None, Surgical    Comment: Vasectomy  Other Topics Concern   Not on file  Social History Narrative   Not on file   Social Drivers of Health   Financial Resource Strain: Low Risk  (06/27/2023)   Overall Financial Resource Strain (CARDIA)    Difficulty of Paying Living Expenses: Not hard at all  Food Insecurity: No Food Insecurity (06/27/2023)   Hunger Vital Sign    Worried About Running Out of Food in the Last Year: Never true    Ran Out of Food in the Last Year: Never true  Transportation Needs: No Transportation Needs (06/18/2022)   PRAPARE - Administrator, Civil Service (Medical): No    Lack of Transportation (Non-Medical): No  Physical Activity: Sufficiently Active (06/27/2023)   Exercise Vital Sign    Days of Exercise per Week: 4 days    Minutes of Exercise per Session: 90 min  Stress: No  Stress Concern Present (06/27/2023)   [employer] of Occupational Health - Occupational Stress Questionnaire    Feeling of Stress : Only a little  Social Connections: Moderately Integrated (06/27/2023)   Social Connection and Isolation  Panel    Frequency of Communication with Friends and Family: More than three times a week    Frequency of Social Gatherings with Friends and Family: More than three times a week    Attends Religious Services: 1 to 4 times per year    Active Member of [employer] or Organizations: No    Attends Banker Meetings: Never    Marital Status: Married  Catering Manager Violence: Not At Risk (06/27/2023)   Humiliation, Afraid, Rape, and Kick questionnaire    Fear of Current or Ex-Partner: No    Emotionally Abused: No    Physically Abused: No    Sexually Abused: No     Current Outpatient Medications:    albuterol  (VENTOLIN  HFA) 108 (90 Base) MCG/ACT inhaler, Inhale 2 puffs into the lungs every 6 (six) hours as needed for wheezing or shortness of breath. (Patient not taking: Reported on 01/26/2024), Disp: 8 g, Rfl: 0  Allergies  Allergen Reactions   Macadamia Nut Oil     Difficulty breathing     ROS  Constitutional: Negative for fever or weight change.  Respiratory: Negative for cough and shortness of breath.   Cardiovascular: Negative for chest pain or palpitations.  Gastrointestinal: Negative for abdominal pain, no bowel changes.  Musculoskeletal: Negative for gait problem or joint swelling.  Skin: Negative for rash.  Neurological: Negative for dizziness or headache.  No other specific complaints in a complete review of systems (except as listed in HPI above).   Objective  There were no vitals filed for this visit.  There is no height or weight on file to calculate BMI.  Physical Exam ***  No results found for this or any previous visit (from the past 2160 hours).  Diabetic Foot Exam: Diabetic Foot Exam - Simple   No data  filed    ***  Fall Risk:    06/27/2023    9:00 AM 10/26/2022    9:42 AM 10/18/2022   10:29 AM 08/26/2022    2:48 PM 06/18/2022    9:13 AM  Fall Risk   Falls in the past year? 0 0 0 0 0  Number falls in past yr: 0 0 0 0 0  Injury with Fall? 0 0 0 0 0  Risk for fall due to : No Fall Risks  No Fall Risks    Follow up Falls prevention discussed  Falls prevention discussed;Education provided;Falls evaluation completed Falls evaluation completed  Falls evaluation completed      Data saved with a previous flowsheet row definition   ***  Functional Status Survey:   ***  Assessment & Plan  There are no diagnoses linked to this encounter.  -USPSTF grade A and B recommendations reviewed with patient; age-appropriate recommendations, preventive care, screening tests, etc discussed and encouraged; healthy living encouraged; see AVS for patient education given to patient -Discussed importance of 150 minutes of physical activity weekly, eat two servings of fish weekly, eat one serving of tree nuts ( cashews, pistachios, pecans, almonds.SABRA) every other day, eat 6 servings of fruit/vegetables daily and drink plenty of water and avoid sweet beverages.   -Reviewed Health Maintenance: ***

## 2025-01-21 ENCOUNTER — Other Ambulatory Visit: Payer: Self-pay

## 2025-01-25 ENCOUNTER — Ambulatory Visit: Payer: Self-pay | Admitting: Endocrinology
